# Patient Record
Sex: Male | Born: 1980
Health system: Southern US, Community
[De-identification: ages and names within clinical notes are randomized; demographics above are authoritative.]

## PROBLEM LIST (undated history)

## (undated) DIAGNOSIS — S46811A Strain of other muscles, fascia and tendons at shoulder and upper arm level, right arm, initial encounter: Secondary | ICD-10-CM

---

## 2011-10-30 ENCOUNTER — Ambulatory Visit (HOSPITAL_BASED_OUTPATIENT_CLINIC_OR_DEPARTMENT_OTHER)
Admission: RE | Admit: 2011-10-30 | Discharge: 2011-10-30 | Disposition: A | Discharge status: Home / Self Care | Payer: Self-pay | Source: Ambulatory Visit | Attending: Occupational Medicine | Admitting: Occupational Medicine

## 2011-10-30 ENCOUNTER — Other Ambulatory Visit: Payer: Self-pay | Admitting: Occupational Medicine

## 2011-10-30 DIAGNOSIS — Z0000 Encounter for general adult medical examination without abnormal findings: Secondary | ICD-10-CM | POA: Insufficient documentation

## 2013-02-10 ENCOUNTER — Encounter: Payer: Self-pay | Admitting: Sports Medicine

## 2013-02-10 ENCOUNTER — Ambulatory Visit (INDEPENDENT_AMBULATORY_CARE_PROVIDER_SITE_OTHER): Payer: Managed Care, Other (non HMO) | Admitting: Sports Medicine

## 2013-02-10 VITALS — BP 121/73 | HR 65 | Wt 165.0 lb

## 2013-02-10 DIAGNOSIS — M545 Low back pain, unspecified: Secondary | ICD-10-CM

## 2013-02-10 DIAGNOSIS — M25519 Pain in unspecified shoulder: Secondary | ICD-10-CM

## 2013-02-10 DIAGNOSIS — Z299 Encounter for prophylactic measures, unspecified: Secondary | ICD-10-CM

## 2013-02-10 DIAGNOSIS — M25511 Pain in right shoulder: Secondary | ICD-10-CM | POA: Insufficient documentation

## 2013-02-10 DIAGNOSIS — Z0000 Encounter for general adult medical examination without abnormal findings: Secondary | ICD-10-CM | POA: Insufficient documentation

## 2013-02-10 MED ORDER — MELOXICAM 15 MG PO TABS: ORAL_TABLET | ORAL | Status: DC

## 2013-02-10 MED ORDER — PREDNISONE 50 MG PO TABS: ORAL_TABLET | ORAL | Status: DC

## 2013-02-10 MED ORDER — CYCLOBENZAPRINE HCL 10 MG PO TABS: ORAL_TABLET | ORAL | Status: DC

## 2013-02-10 NOTE — Assessment & Plan Note (Signed)
Likely rotator cuff dysfunction. Home rehabilitation with Rockwood exercises. Return in 6 weeks, consider injection if no better.

## 2013-02-10 NOTE — Assessment & Plan Note (Signed)
Patient does desire to establish care here.

## 2013-02-10 NOTE — Progress Notes (Signed)
  Subjective:    CC: Back and shoulder pain   HPI:  This is a very pleasant 32 year old male, who comes in with a one-week history of pain he localizes in the midline of the low back radiating to the paralumbar muscles, he denies any radicular symptoms, he denies any injury, he just woke up one day with this pain. Pain is worsened with sitting, Valsalva, flexion of the spine and, and flexion of his hips. He denies any bowel or bladder dysfunction, and denies any constitutional symptoms.  Right shoulder pain: Has been painful for years, localized over the deltoid, worse with overhead activities. No recent trauma. No pain in the neck, the radiation down the arm, numbness or tingling in the hands.  Past medical history, Surgical history, Family history not pertinant except as noted below, Social history, Allergies, and medications have been entered into the medical record, reviewed, and no changes needed.   Review of Systems: No headache, visual changes, nausea, vomiting, diarrhea, constipation, dizziness, abdominal pain, skin rash, fevers, chills, night sweats, swollen lymph nodes, weight loss, chest pain, body aches, joint swelling, muscle aches, shortness of breath, mood changes, visual or auditory hallucinations.  Objective:    General: Well Developed, well nourished, and in no acute distress.  Neuro: Alert and oriented x3, extra-ocular muscles intact, sensation grossly intact.  HEENT: Normocephalic, atraumatic, pupils equal round reactive to light, neck supple, no masses, no lymphadenopathy, thyroid nonpalpable.  Skin: Warm and dry, no rashes noted.  Cardiac: Regular rate and rhythm, no murmurs rubs or gallops.  Respiratory: Clear to auscultation bilaterally. Not using accessory muscles, speaking in full sentences.  Abdominal: Soft, nontender, nondistended, positive bowel sounds, no masses, no organomegaly.  Back Exam:  Inspection: Unremarkable  Motion: Flexion 45 deg, Extension 45 deg,  Side Bending to 45 deg bilaterally,  Rotation to 45 deg bilaterally  SLR laying: Bilaterally reproduces pain in the back but no radicular symptoms.  XSLR laying: Negative  Palpable tenderness: None. FABER: negative. Sensory change: Gross sensation intact to all lumbar and sacral dermatomes.  Reflexes: 2+ at both patellar tendons, 2+ at achilles tendons, Babinski's downgoing.  Strength at foot  Plantar-flexion: 5/5 Dorsi-flexion: 5/5 Eversion: 5/5 Inversion: 5/5  Leg strength  Quad: 5/5 Hamstring: 5/5 Hip flexor: 5/5 Hip abductors: 5/5  Gait unremarkable. Right Shoulder: Inspection reveals no abnormalities, atrophy or asymmetry. Palpation is normal with no tenderness over AC joint or bicipital groove. ROM is full in all planes. Rotator cuff strength normal throughout. Positive Neer and Hawkin's tests, empty can sign. Speeds and Yergason's tests normal. No labral pathology noted with negative Obrien's, negative clunk and good stability. Normal scapular function observed. No painful arc and no drop arm sign. No apprehension sign  Impression and Recommendations:    The patient was counselled, risk factors were discussed, anticipatory guidance given.

## 2013-02-10 NOTE — Assessment & Plan Note (Signed)
Predominantly axial and discogenic. We will start conservatively with prednisone, NSAIDs, Flexeril. Home rehabilitation. Return in 6 weeks.

## 2013-03-23 ENCOUNTER — Encounter: Payer: Self-pay | Admitting: Sports Medicine

## 2013-03-23 ENCOUNTER — Ambulatory Visit (INDEPENDENT_AMBULATORY_CARE_PROVIDER_SITE_OTHER): Payer: Managed Care, Other (non HMO) | Admitting: Sports Medicine

## 2013-03-23 VITALS — BP 119/78 | HR 79 | Ht 74.0 in | Wt 164.0 lb

## 2013-03-23 DIAGNOSIS — M25519 Pain in unspecified shoulder: Secondary | ICD-10-CM

## 2013-03-23 DIAGNOSIS — M25511 Pain in right shoulder: Secondary | ICD-10-CM

## 2013-03-23 DIAGNOSIS — M545 Low back pain, unspecified: Secondary | ICD-10-CM

## 2013-03-23 NOTE — Assessment & Plan Note (Signed)
Persistent pain despite failure of conservative measures including Rockwood exercises, NSAIDs. Subacromial injection as above. Return to see me in one month.

## 2013-03-23 NOTE — Progress Notes (Signed)
  Subjective:    CC: Followup  HPI: Right shoulder pain: Clinically subacromial bursitis, pain is still a pleasant deltoid without radiation, worse with overhead activities, no trauma. Failed conservative measures including Rockwood exercises and NSAIDs.  Low back pain: Resolved.  Past medical history, Surgical history, Family history not pertinant except as noted below, Social history, Allergies, and medications have been entered into the medical record, reviewed, and no changes needed.   Review of Systems: No fevers, chills, night sweats, weight loss, chest pain, or shortness of breath.   Objective:    General: Well Developed, well nourished, and in no acute distress.  Neuro: Alert and oriented x3, extra-ocular muscles intact, sensation grossly intact.  HEENT: Normocephalic, atraumatic, pupils equal round reactive to light, neck supple, no masses, no lymphadenopathy, thyroid nonpalpable.  Skin: Warm and dry, no rashes. Cardiac: Regular rate and rhythm, no murmurs rubs or gallops, no lower extremity edema.  Respiratory: Clear to auscultation bilaterally. Not using accessory muscles, speaking in full sentences. Right Shoulder: Inspection reveals no abnormalities, atrophy or asymmetry. Palpation is normal with no tenderness over AC joint or bicipital groove. ROM is full in all planes. Rotator cuff strength normal throughout. Positive Hawkins, empty can sign, negative Neer test. Speeds and Yergason's tests normal. No labral pathology noted with negative Obrien's, negative clunk and good stability. Normal scapular function observed. No painful arc and no drop arm sign. No apprehension sign  Procedure: Real-time Ultrasound Guided Injection of right subacromial bursa  Device: GE Logiq E  Verbal informed consent obtained.  Time-out conducted.  Noted no overlying erythema, induration, or other signs of local infection.  Skin prepped in a sterile fashion.  Local anesthesia: Topical  Ethyl chloride.  With sterile technique and under real time ultrasound guidance:  1 cc Kenalog 40, 3 cc lidocaine injected easily into the subacromial bursa.  Completed without difficulty  Pain immediately resolved suggesting accurate placement of the medication.  Advised to call if fevers/chills, erythema, induration, drainage, or persistent bleeding.  Images permanently stored and available for review in the ultrasound unit.  Impression: Technically successful ultrasound guided injection.  Impression and Recommendations:

## 2013-03-23 NOTE — Assessment & Plan Note (Signed)
Resolved

## 2013-04-21 ENCOUNTER — Encounter: Payer: Self-pay | Admitting: Sports Medicine

## 2013-04-21 ENCOUNTER — Ambulatory Visit (INDEPENDENT_AMBULATORY_CARE_PROVIDER_SITE_OTHER): Payer: Managed Care, Other (non HMO) | Admitting: Sports Medicine

## 2013-04-21 VITALS — BP 133/91 | HR 74 | Ht 74.0 in | Wt 164.0 lb

## 2013-04-21 DIAGNOSIS — M25511 Pain in right shoulder: Secondary | ICD-10-CM

## 2013-04-21 DIAGNOSIS — M25519 Pain in unspecified shoulder: Secondary | ICD-10-CM

## 2013-04-21 NOTE — Progress Notes (Signed)
  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  Past medical history, Surgical history, Family history not pertinant except as noted below, Social history, Allergies, and medications have been entered into the medical record, reviewed, and no changes needed.   Review of Systems: No fevers, chills, night sweats, weight loss, chest pain, or shortness of breath.   Objective:    General: Well Developed, well nourished, and in no acute distress.  Neuro: Alert and oriented x3, extra-ocular muscles intact, sensation grossly intact.  HEENT: Normocephalic, atraumatic, pupils equal round reactive to light, neck supple, no masses, no lymphadenopathy, thyroid nonpalpable.  Skin: Warm and dry, no rashes. Cardiac: Regular rate and rhythm, no murmurs rubs or gallops, no lower extremity edema.  Respiratory: Clear to auscultation bilaterally. Not using accessory muscles, speaking in full sentences. Right Shoulder: Inspection reveals no abnormalities, atrophy or asymmetry. Palpation is normal with no tenderness over AC joint or bicipital groove. ROM is full in all planes. Rotator cuff strength normal throughout. Negative Neer and Empty Can signs, mildly positive Hawkins test. Speeds and Yergason's tests normal. No labral pathology noted with negative Obrien's, negative clunk and good stability. Normal scapular function observed. No painful arc and no drop arm sign. No apprehension sign  Impression and Recommendations:

## 2013-04-21 NOTE — Assessment & Plan Note (Signed)
Essentially resolved. Return as needed.

## 2013-11-16 ENCOUNTER — Ambulatory Visit (INDEPENDENT_AMBULATORY_CARE_PROVIDER_SITE_OTHER): Payer: Managed Care, Other (non HMO) | Admitting: Sports Medicine

## 2013-11-16 ENCOUNTER — Encounter: Payer: Self-pay | Admitting: Sports Medicine

## 2013-11-16 ENCOUNTER — Ambulatory Visit (INDEPENDENT_AMBULATORY_CARE_PROVIDER_SITE_OTHER): Payer: Managed Care, Other (non HMO)

## 2013-11-16 VITALS — BP 120/79 | HR 71 | Ht 74.0 in | Wt 158.0 lb

## 2013-11-16 DIAGNOSIS — M25511 Pain in right shoulder: Secondary | ICD-10-CM

## 2013-11-16 DIAGNOSIS — M25519 Pain in unspecified shoulder: Secondary | ICD-10-CM

## 2013-11-16 MED ORDER — DICLOFENAC SODIUM 75 MG PO TBEC: 75.0000 mg | DELAYED_RELEASE_TABLET | Freq: Two times a day (BID) | ORAL | Status: DC

## 2013-11-16 NOTE — Progress Notes (Signed)
  Subjective:    CC: Right shoulder injury  HPI: This is a very pleasant 33 year old male, we have been treating him for subacromial bursitis, he was doing well after a subacromial injection in January unfortunately had a recent fall backwards onto an outstretched right hand. He had immediate pain at the anterior and posterior joint line, and presents for further evaluation. Pain is severe, persistent, worse with reaching across his chest.  Past medical history, Surgical history, Family history not pertinant except as noted below, Social history, Allergies, and medications have been entered into the medical record, reviewed, and no changes needed.   Review of Systems: No fevers, chills, night sweats, weight loss, chest pain, or shortness of breath.   Objective:    General: Well Developed, well nourished, and in no acute distress.  Neuro: Alert and oriented x3, extra-ocular muscles intact, sensation grossly intact.  HEENT: Normocephalic, atraumatic, pupils equal round reactive to light, neck supple, no masses, no lymphadenopathy, thyroid nonpalpable.  Skin: Warm and dry, no rashes. Cardiac: Regular rate and rhythm, no murmurs rubs or gallops, no lower extremity edema.  Respiratory: Clear to auscultation bilaterally. Not using accessory muscles, speaking in full sentences. Right Shoulder: Inspection reveals no abnormalities, atrophy or asymmetry. Tendon palpation of the posterior joint line. ROM is full in all planes. Rotator cuff strength normal throughout. Positive Hawkins, Neers, Empty can signs. Speeds and Yergason's tests normal. No labral pathology noted with negative Obrien's, negative crank, negative clunk, and good stability. Normal scapular function observed. No painful arc and no drop arm sign. No apprehension sign  Impression and Recommendations:

## 2013-11-16 NOTE — Assessment & Plan Note (Signed)
Overall was doing well after subacromial injection 8 months ago. Unfortunately had a recent fall onto an extended right arm. Now has pain predominantly at the joint line anteriorly and posteriorly. X-rays, swelling, Voltaren, rehabilitation exercises. Return in 2 weeks.

## 2013-11-30 ENCOUNTER — Encounter: Payer: Self-pay | Admitting: Sports Medicine

## 2013-11-30 ENCOUNTER — Ambulatory Visit (INDEPENDENT_AMBULATORY_CARE_PROVIDER_SITE_OTHER): Payer: Managed Care, Other (non HMO) | Admitting: Sports Medicine

## 2013-11-30 VITALS — BP 128/81 | HR 61 | Ht 74.0 in | Wt 163.0 lb

## 2013-11-30 DIAGNOSIS — M25511 Pain in right shoulder: Secondary | ICD-10-CM

## 2013-11-30 NOTE — Assessment & Plan Note (Signed)
Subacromial injection 9 months ago. Fallen to an extended right arm with a recurrence of pain, we treated him conservatively with a sling, rehabilitation exercises and NSAIDs. He returns today pain-free. He gets a pain traveling below the elbow in C8 distribution, we should keep this in the back of our heads if this recurs.

## 2013-11-30 NOTE — Progress Notes (Signed)
  Subjective:    CC: Followup  HPI: This is a very pleasant 33 year old male with a history of rotator cuff dysfunction. After failing conservative measures he did extremely well with a subacromial injection approximately 9 months ago. Unfortunately he had a fall, with a recurrence of pain 2 weeks ago. We placed him in a sling, and had him do some rehabilitation exercises, he returns pain-free.  Past medical history, Surgical history, Family history not pertinant except as noted below, Social history, Allergies, and medications have been entered into the medical record, reviewed, and no changes needed.   Review of Systems: No fevers, chills, night sweats, weight loss, chest pain, or shortness of breath.   Objective:    General: Well Developed, well nourished, and in no acute distress.  Neuro: Alert and oriented x3, extra-ocular muscles intact, sensation grossly intact.  HEENT: Normocephalic, atraumatic, pupils equal round reactive to light, neck supple, no masses, no lymphadenopathy, thyroid nonpalpable.  Skin: Warm and dry, no rashes. Cardiac: Regular rate and rhythm, no murmurs rubs or gallops, no lower extremity edema.  Respiratory: Clear to auscultation bilaterally. Not using accessory muscles, speaking in full sentences. Right Shoulder: Inspection reveals no abnormalities, atrophy or asymmetry. Palpation is normal with no tenderness over AC joint or bicipital groove. ROM is full in all planes. Rotator cuff strength normal throughout. No signs of impingement with negative Neer and Hawkin's tests, empty can. Speeds and Yergason's tests normal. No labral pathology noted with negative Obrien's, negative crank, negative clunk, and good stability. Normal scapular function observed. No painful arc and no drop arm sign. No apprehension sign  Impression and Recommendations:

## 2013-12-24 ENCOUNTER — Encounter: Payer: Self-pay | Admitting: Sports Medicine

## 2013-12-24 ENCOUNTER — Ambulatory Visit (INDEPENDENT_AMBULATORY_CARE_PROVIDER_SITE_OTHER): Payer: Managed Care, Other (non HMO)

## 2013-12-24 ENCOUNTER — Ambulatory Visit (INDEPENDENT_AMBULATORY_CARE_PROVIDER_SITE_OTHER): Payer: Managed Care, Other (non HMO) | Admitting: Sports Medicine

## 2013-12-24 DIAGNOSIS — M25511 Pain in right shoulder: Secondary | ICD-10-CM

## 2013-12-24 DIAGNOSIS — M542 Cervicalgia: Secondary | ICD-10-CM

## 2013-12-24 MED ORDER — NAPROXEN 500 MG PO TABS: 500.0000 mg | ORAL_TABLET | Freq: Two times a day (BID) | ORAL | Status: DC

## 2013-12-24 NOTE — Assessment & Plan Note (Addendum)
Recurrence of pain, impingement despite injection proximally 10 months ago. Repeat subacromial injection. He is getting some right-sided C8 radicular symptoms. I do think that this is related to abnormal scapular biomechanics related to his impingement syndrome. Subacromial injection, shoulder MRI, cervical spine x-rays, formal physical therapy of the neck and shoulder. Return in one month. We can proceed with C-spine MRI if he continues to have symptoms.  Subacromial injection did resolve lower arm symptoms as well suggesting that the subacromial bursa is the principal pain generator, we are going to switch to prescription strength Aleve.

## 2013-12-24 NOTE — Progress Notes (Signed)
  Subjective:    CC: follow-up  HPI: Right shoulder pain: Initially responded well for 9 months after a subacromial injection, he had a fall and then had a recurrence of pain. Localized over the deltoid, worse with overhead activities, also has some pain with numbness and tingling coming down the arm. He feels as though the shoulder pain started first. Moderate, persistent.  Past medical history, Surgical history, Family history not pertinant except as noted below, Social history, Allergies, and medications have been entered into the medical record, reviewed, and no changes needed.   Review of Systems: No fevers, chills, night sweats, weight loss, chest pain, or shortness of breath.   Objective:    General: Well Developed, well nourished, and in no acute distress.  Neuro: Alert and oriented x3, extra-ocular muscles intact, sensation grossly intact.  HEENT: Normocephalic, atraumatic, pupils equal round reactive to light, neck supple, no masses, no lymphadenopathy, thyroid nonpalpable.  Skin: Warm and dry, no rashes. Cardiac: Regular rate and rhythm, no murmurs rubs or gallops, no lower extremity edema.  Respiratory: Clear to auscultation bilaterally. Not using accessory muscles, speaking in full sentences. Neck: Negative spurling's Full neck range of motion Grip strength and sensation normal in bilateral hands Strength good C4 to T1 distribution No sensory change to C4 to T1 Reflexes normal rightShoulder: Inspection reveals no abnormalities, atrophy or asymmetry. Palpation is normal with no tenderness over AC joint or bicipital groove. ROM is full in all planes. Rotator cuff strength normal throughout. Positive Neer and Hawkin's tests, empty can. Speeds and Yergason's tests normal. No labral pathology noted with negative Obrien's, negative crank, negative clunk, and good stability. Normal scapular function observed. No painful arc and no drop arm sign. No apprehension  sign  Procedure: Real-time Ultrasound Guided Injection of right subacromial bursa Device: GE Logiq E  Verbal informed consent obtained.  Time-out conducted.  Noted no overlying erythema, induration, or other signs of local infection.  Skin prepped in a sterile fashion.  Local anesthesia: Topical Ethyl chloride.  With sterile technique and under real time ultrasound guidance:  1 mL kenalog 40, 4 mL lidocaine injected easily. Completed without difficulty  Pain immediately resolved suggesting accurate placement of the medication.  Advised to call if fevers/chills, erythema, induration, drainage, or persistent bleeding.  Images permanently stored and available for review in the ultrasound unit.  Impression: Technically successful ultrasound guided injection.  Impression and Recommendations:

## 2014-01-21 ENCOUNTER — Ambulatory Visit: Payer: Self-pay | Admitting: Sports Medicine

## 2014-03-05 ENCOUNTER — Telehealth: Payer: Self-pay

## 2014-03-05 NOTE — Telephone Encounter (Signed)
MRI Shoulder without contrast approved by peer to peer with Dr Benjamin Stainhekkekandam. Patient scheduled for 03/08/2014 at 2 pm.

## 2014-03-08 ENCOUNTER — Ambulatory Visit (INDEPENDENT_AMBULATORY_CARE_PROVIDER_SITE_OTHER): Payer: Managed Care, Other (non HMO)

## 2014-03-08 DIAGNOSIS — M779 Enthesopathy, unspecified: Secondary | ICD-10-CM

## 2014-03-08 DIAGNOSIS — M25511 Pain in right shoulder: Secondary | ICD-10-CM

## 2014-03-08 DIAGNOSIS — M7551 Bursitis of right shoulder: Secondary | ICD-10-CM

## 2014-03-08 DIAGNOSIS — M75101 Unspecified rotator cuff tear or rupture of right shoulder, not specified as traumatic: Secondary | ICD-10-CM

## 2014-03-11 ENCOUNTER — Ambulatory Visit (INDEPENDENT_AMBULATORY_CARE_PROVIDER_SITE_OTHER): Payer: Managed Care, Other (non HMO) | Admitting: Sports Medicine

## 2014-03-11 ENCOUNTER — Encounter: Payer: Self-pay | Admitting: Sports Medicine

## 2014-03-11 DIAGNOSIS — M25511 Pain in right shoulder: Secondary | ICD-10-CM

## 2014-03-11 MED ORDER — CYCLOBENZAPRINE HCL 10 MG PO TABS: 10.0000 mg | ORAL_TABLET | Freq: Every day | ORAL | Status: DC

## 2014-03-11 NOTE — Progress Notes (Signed)
  Subjective:    CC: MRI results  HPI: Victor Donaldson returns, Victor Donaldson has had 3 subacromial ultrasound guided injections, and physical therapy, unfortunately continues to have pain that he localizes over the deltoid, worse with overhead activities, moderate, persistent. He did have an MRI the results of which will be dictated below.  Past medical history, Surgical history, Family history not pertinant except as noted below, Social history, Allergies, and medications have been entered into the medical record, reviewed, and no changes needed.   Review of Systems: No fevers, chills, night sweats, weight loss, chest pain, or shortness of breath.   Objective:    General: Well Developed, well nourished, and in no acute distress.  Neuro: Alert and oriented x3, extra-ocular muscles intact, sensation grossly intact.  HEENT: Normocephalic, atraumatic, pupils equal round reactive to light, neck supple, no masses, no lymphadenopathy, thyroid nonpalpable.  Skin: Warm and dry, no rashes. Cardiac: Regular rate and rhythm, no murmurs rubs or gallops, no lower extremity edema.  Respiratory: Clear to auscultation bilaterally. Not using accessory muscles, speaking in full sentences.  MRI was personally reviewed and do show supraspinatus tendinosis with a small insertional tear caretaker surface, is also a downsloping acromion and some subacromial bursitis.  Impression and Recommendations:

## 2014-03-11 NOTE — Assessment & Plan Note (Signed)
Victor Donaldson continues to have right-sided impingement type symptoms. He's been through rehabilitation, oral medications, and 3 ultrasound-guided subacromial injections. Symptoms are persistent and the most recent injection only provided a month or so of relief. MRI does show significant supraspinatus tendinopathy with a downsloping acromion, subacromial bursitis, and a small articular sided tear at the insertion. At this point I do think he has become a candidate for shoulder arthroscopy, referral to Dr. Ave Filterhandler.

## 2014-03-17 ENCOUNTER — Other Ambulatory Visit: Payer: Self-pay | Admitting: Orthopedic Surgery

## 2014-03-17 ENCOUNTER — Encounter (HOSPITAL_BASED_OUTPATIENT_CLINIC_OR_DEPARTMENT_OTHER): Payer: Self-pay | Admitting: *Deleted

## 2014-03-22 ENCOUNTER — Ambulatory Visit (HOSPITAL_BASED_OUTPATIENT_CLINIC_OR_DEPARTMENT_OTHER): Payer: Managed Care, Other (non HMO) | Admitting: Anesthesiology

## 2014-03-22 ENCOUNTER — Encounter (HOSPITAL_BASED_OUTPATIENT_CLINIC_OR_DEPARTMENT_OTHER): Payer: Self-pay

## 2014-03-22 ENCOUNTER — Encounter (HOSPITAL_BASED_OUTPATIENT_CLINIC_OR_DEPARTMENT_OTHER)
Admission: RE | Disposition: A | Discharge status: Home / Self Care | Payer: Self-pay | Source: Ambulatory Visit | Attending: Orthopedic Surgery

## 2014-03-22 ENCOUNTER — Ambulatory Visit (HOSPITAL_BASED_OUTPATIENT_CLINIC_OR_DEPARTMENT_OTHER)
Admission: RE | Admit: 2014-03-22 | Discharge: 2014-03-22 | Disposition: A | Discharge status: Home / Self Care | Payer: Managed Care, Other (non HMO) | Source: Ambulatory Visit | Attending: Orthopedic Surgery | Admitting: Orthopedic Surgery

## 2014-03-22 DIAGNOSIS — M75101 Unspecified rotator cuff tear or rupture of right shoulder, not specified as traumatic: Secondary | ICD-10-CM | POA: Diagnosis not present

## 2014-03-22 HISTORY — DX: Strain of other muscles, fascia and tendons at shoulder and upper arm level, right arm, initial encounter: S46.811A

## 2014-03-22 LAB — POCT HEMOGLOBIN-HEMACUE: HEMOGLOBIN: 14 g/dL (ref 13.0–17.0)

## 2014-03-22 SURGERY — SHOULDER ARTHROSCOPY WITH SUBACROMIAL DECOMPRESSION AND DISTAL CLAVICLE EXCISION
Anesthesia: General | Site: Shoulder | Laterality: Right

## 2014-03-22 MED ORDER — LIDOCAINE HCL (CARDIAC) 20 MG/ML IV SOLN
INTRAVENOUS | Status: DC | PRN
  Administered 2014-03-22: 50 mg via INTRAVENOUS

## 2014-03-22 MED ORDER — SUCCINYLCHOLINE CHLORIDE 20 MG/ML IJ SOLN
INTRAMUSCULAR | Status: DC | PRN
  Administered 2014-03-22: 100 mg via INTRAVENOUS

## 2014-03-22 MED ORDER — PROPOFOL 10 MG/ML IV BOLUS
INTRAVENOUS | Status: AC
  Filled 2014-03-22: qty 20

## 2014-03-22 MED ORDER — OXYCODONE HCL 5 MG PO TABS: 5.0000 mg | ORAL_TABLET | Freq: Once | ORAL | Status: DC | PRN

## 2014-03-22 MED ORDER — BUPIVACAINE-EPINEPHRINE (PF) 0.5% -1:200000 IJ SOLN
INTRAMUSCULAR | Status: DC | PRN
  Administered 2014-03-22: 25 mL via PERINEURAL

## 2014-03-22 MED ORDER — MIDAZOLAM HCL 2 MG/2ML IJ SOLN
1.0000 mg | INTRAMUSCULAR | Status: DC | PRN
  Administered 2014-03-22: 2 mg via INTRAVENOUS

## 2014-03-22 MED ORDER — OXYCODONE HCL 5 MG/5ML PO SOLN: 5.0000 mg | Freq: Once | ORAL | Status: DC | PRN

## 2014-03-22 MED ORDER — POVIDONE-IODINE 7.5 % EX SOLN: Freq: Once | CUTANEOUS | Status: DC

## 2014-03-22 MED ORDER — DEXAMETHASONE SODIUM PHOSPHATE 4 MG/ML IJ SOLN
INTRAMUSCULAR | Status: DC | PRN
  Administered 2014-03-22: 10 mg via INTRAVENOUS

## 2014-03-22 MED ORDER — ONDANSETRON HCL 4 MG/2ML IJ SOLN
INTRAMUSCULAR | Status: DC | PRN
  Administered 2014-03-22: 4 mg via INTRAVENOUS

## 2014-03-22 MED ORDER — MIDAZOLAM HCL 2 MG/2ML IJ SOLN
INTRAMUSCULAR | Status: AC
  Filled 2014-03-22: qty 2

## 2014-03-22 MED ORDER — LACTATED RINGERS IV SOLN
INTRAVENOUS | Status: DC
  Administered 2014-03-22 (×2): via INTRAVENOUS

## 2014-03-22 MED ORDER — CEFAZOLIN SODIUM-DEXTROSE 2-3 GM-% IV SOLR
INTRAVENOUS | Status: AC
  Filled 2014-03-22: qty 50

## 2014-03-22 MED ORDER — CEFAZOLIN SODIUM-DEXTROSE 2-3 GM-% IV SOLR
2.0000 g | INTRAVENOUS | Status: AC
  Administered 2014-03-22: 2 g via INTRAVENOUS

## 2014-03-22 MED ORDER — FENTANYL CITRATE 0.05 MG/ML IJ SOLN
INTRAMUSCULAR | Status: AC
  Filled 2014-03-22: qty 6

## 2014-03-22 MED ORDER — FENTANYL CITRATE 0.05 MG/ML IJ SOLN
50.0000 ug | INTRAMUSCULAR | Status: DC | PRN
  Administered 2014-03-22: 100 ug via INTRAVENOUS

## 2014-03-22 MED ORDER — SODIUM CHLORIDE 0.9 % IR SOLN
Status: DC | PRN
  Administered 2014-03-22: 6000 mL

## 2014-03-22 MED ORDER — OXYCODONE-ACETAMINOPHEN 5-325 MG PO TABS: 1.0000 | ORAL_TABLET | ORAL | Status: DC | PRN

## 2014-03-22 MED ORDER — PROPOFOL 10 MG/ML IV BOLUS
INTRAVENOUS | Status: DC | PRN
  Administered 2014-03-22: 200 mg via INTRAVENOUS

## 2014-03-22 MED ORDER — ONDANSETRON HCL 4 MG/2ML IJ SOLN: 4.0000 mg | Freq: Once | INTRAMUSCULAR | Status: DC | PRN

## 2014-03-22 MED ORDER — FENTANYL CITRATE 0.05 MG/ML IJ SOLN
INTRAMUSCULAR | Status: AC
  Filled 2014-03-22: qty 2

## 2014-03-22 MED ORDER — DOCUSATE SODIUM 100 MG PO CAPS: 100.0000 mg | ORAL_CAPSULE | Freq: Three times a day (TID) | ORAL | Status: DC | PRN

## 2014-03-22 MED ORDER — HYDROMORPHONE HCL 1 MG/ML IJ SOLN: 0.2500 mg | INTRAMUSCULAR | Status: DC | PRN

## 2014-03-22 SURGICAL SUPPLY — 82 items
BENZOIN TINCTURE PRP APPL 2/3 (GAUZE/BANDAGES/DRESSINGS) IMPLANT
BLADE CLIPPER SURG (BLADE) IMPLANT
BLADE SURG 15 STRL LF DISP TIS (BLADE) IMPLANT
BLADE SURG 15 STRL SS (BLADE)
BLADE VORTEX 6.0 (BLADE) IMPLANT
BUR OVAL 4.0 (BURR) ×2 IMPLANT
CANNULA 5.75X71 LONG (CANNULA) ×2 IMPLANT
CANNULA TWIST IN 8.25X7CM (CANNULA) IMPLANT
CHLORAPREP W/TINT 26ML (MISCELLANEOUS) ×2 IMPLANT
DECANTER SPIKE VIAL GLASS SM (MISCELLANEOUS) IMPLANT
DRAPE INCISE IOBAN 66X45 STRL (DRAPES) ×2 IMPLANT
DRAPE STERI 35X30 U-POUCH (DRAPES) ×2 IMPLANT
DRAPE SURG 17X23 STRL (DRAPES) ×2 IMPLANT
DRAPE U 20/CS (DRAPES) ×2 IMPLANT
DRAPE U-SHAPE 47X51 STRL (DRAPES) ×2 IMPLANT
DRAPE U-SHAPE 76X120 STRL (DRAPES) ×4 IMPLANT
DRSG PAD ABDOMINAL 8X10 ST (GAUZE/BANDAGES/DRESSINGS) ×2 IMPLANT
ELECT BLADE 4.0 EZ CLEAN MEGAD (MISCELLANEOUS)
ELECT REM PT RETURN 9FT ADLT (ELECTROSURGICAL) ×2
ELECTRODE BLDE 4.0 EZ CLN MEGD (MISCELLANEOUS) IMPLANT
ELECTRODE REM PT RTRN 9FT ADLT (ELECTROSURGICAL) ×1 IMPLANT
GAUZE SPONGE 4X4 12PLY STRL (GAUZE/BANDAGES/DRESSINGS) ×2 IMPLANT
GAUZE SPONGE 4X4 16PLY XRAY LF (GAUZE/BANDAGES/DRESSINGS) IMPLANT
GAUZE XEROFORM 1X8 LF (GAUZE/BANDAGES/DRESSINGS) ×2 IMPLANT
GLOVE BIO SURGEON STRL SZ7 (GLOVE) ×2 IMPLANT
GLOVE BIO SURGEON STRL SZ7.5 (GLOVE) ×2 IMPLANT
GLOVE BIOGEL PI IND STRL 7.0 (GLOVE) ×2 IMPLANT
GLOVE BIOGEL PI IND STRL 8 (GLOVE) ×1 IMPLANT
GLOVE BIOGEL PI INDICATOR 7.0 (GLOVE) ×2
GLOVE BIOGEL PI INDICATOR 8 (GLOVE) ×1
GLOVE ECLIPSE 6.5 STRL STRAW (GLOVE) ×2 IMPLANT
GOWN STRL REUS W/ TWL LRG LVL3 (GOWN DISPOSABLE) ×2 IMPLANT
GOWN STRL REUS W/TWL LRG LVL3 (GOWN DISPOSABLE) ×2
GOWN STRL REUS W/TWL XL LVL3 (GOWN DISPOSABLE) IMPLANT
GOWN STRL REUS W/TWL XL LVL4 (GOWN DISPOSABLE) IMPLANT
LASSO CRESCENT QUICKPASS (SUTURE) IMPLANT
LIQUID BAND (GAUZE/BANDAGES/DRESSINGS) IMPLANT
MANIFOLD NEPTUNE II (INSTRUMENTS) ×2 IMPLANT
NDL SUT 6 .5 CRC .975X.05 MAYO (NEEDLE) IMPLANT
NEEDLE 1/2 CIR CATGUT .05X1.09 (NEEDLE) IMPLANT
NEEDLE MAYO TAPER (NEEDLE)
NEEDLE SCORPION MULTI FIRE (NEEDLE) IMPLANT
NS IRRIG 1000ML POUR BTL (IV SOLUTION) IMPLANT
PACK ARTHROSCOPY DSU (CUSTOM PROCEDURE TRAY) ×2 IMPLANT
PACK BASIN DAY SURGERY FS (CUSTOM PROCEDURE TRAY) ×2 IMPLANT
PENCIL BUTTON HOLSTER BLD 10FT (ELECTRODE) IMPLANT
RESECTOR FULL RADIUS 4.2MM (BLADE) ×2 IMPLANT
SLEEVE SCD COMPRESS KNEE MED (MISCELLANEOUS) ×2 IMPLANT
SLING ARM IMMOBILIZER MED (SOFTGOODS) IMPLANT
SLING ARM LRG ADULT FOAM STRAP (SOFTGOODS) ×2 IMPLANT
SLING ARM MED ADULT FOAM STRAP (SOFTGOODS) IMPLANT
SLING ARM XL FOAM STRAP (SOFTGOODS) IMPLANT
SPONGE LAP 4X18 X RAY DECT (DISPOSABLE) IMPLANT
STRIP CLOSURE SKIN 1/2X4 (GAUZE/BANDAGES/DRESSINGS) IMPLANT
SUCTION FRAZIER TIP 10 FR DISP (SUCTIONS) IMPLANT
SUPPORT WRAP ARM LG (MISCELLANEOUS) ×2 IMPLANT
SUT 2 FIBERLOOP 20 STRT BLUE (SUTURE)
SUT BONE WAX W31G (SUTURE) IMPLANT
SUT ETHIBOND 2 OS 4 DA (SUTURE) IMPLANT
SUT ETHILON 3 0 PS 1 (SUTURE) ×2 IMPLANT
SUT ETHILON 4 0 PS 2 18 (SUTURE) IMPLANT
SUT FIBERWIRE #2 38 T-5 BLUE (SUTURE)
SUT MNCRL AB 3-0 PS2 18 (SUTURE) IMPLANT
SUT MNCRL AB 4-0 PS2 18 (SUTURE) IMPLANT
SUT PDS AB 0 CT 36 (SUTURE) IMPLANT
SUT PROLENE 3 0 PS 2 (SUTURE) IMPLANT
SUT TIGER TAPE 7 IN WHITE (SUTURE) IMPLANT
SUT VIC AB 0 CT1 27 (SUTURE)
SUT VIC AB 0 CT1 27XBRD ANBCTR (SUTURE) IMPLANT
SUT VIC AB 2-0 SH 27 (SUTURE)
SUT VIC AB 2-0 SH 27XBRD (SUTURE) IMPLANT
SUTURE 2 FIBERLOOP 20 STRT BLU (SUTURE) IMPLANT
SUTURE FIBERWR #2 38 T-5 BLUE (SUTURE) IMPLANT
SYR BULB 3OZ (MISCELLANEOUS) IMPLANT
TAPE FIBER 2MM 7IN #2 BLUE (SUTURE) IMPLANT
TOWEL OR 17X24 6PK STRL BLUE (TOWEL DISPOSABLE) ×2 IMPLANT
TOWEL OR NON WOVEN STRL DISP B (DISPOSABLE) ×2 IMPLANT
TUBE CONNECTING 20X1/4 (TUBING) ×2 IMPLANT
TUBING ARTHROSCOPY IRRIG 16FT (MISCELLANEOUS) ×2 IMPLANT
WAND STAR VAC 90 (SURGICAL WAND) ×2 IMPLANT
WATER STERILE IRR 1000ML POUR (IV SOLUTION) ×2 IMPLANT
YANKAUER SUCT BULB TIP NO VENT (SUCTIONS) IMPLANT

## 2014-03-22 NOTE — Anesthesia Postprocedure Evaluation (Signed)
  Anesthesia Post-op Note  Patient: Victor Donaldson  Procedure(s) Performed: Procedure(s): Right shoulder arthroscopy debridement ,  partial articular-sided supraspinatus tendon avulsion, subacromial decompression (Right)  Patient Location: PACU  Anesthesia Type:GA combined with regional for post-op pain  Level of Consciousness: awake, alert , oriented and patient cooperative  Airway and Oxygen Therapy: Patient Spontanous Breathing  Post-op Pain: none  Post-op Assessment: Post-op Vital signs reviewed, Patient's Cardiovascular Status Stable, Respiratory Function Stable, Patent Airway, No signs of Nausea or vomiting, Adequate PO intake and Pain level controlled  Post-op Vital Signs: Reviewed and stable  Last Vitals:  Filed Vitals:   03/22/14 1500  BP: 132/73  Pulse: 85  Temp:   Resp: 16    Complications: No apparent anesthesia complications

## 2014-03-22 NOTE — Progress Notes (Signed)
Assisted Dr. Crews with right, ultrasound guided, interscalene  block. Side rails up, monitors on throughout procedure. See vital signs in flow sheet. Tolerated Procedure well. 

## 2014-03-22 NOTE — Transfer of Care (Signed)
Immediate Anesthesia Transfer of Care Note  Patient: Victor RighterJoshua C Rauda  Procedure(s) Performed: Procedure(s): Right shoulder arthroscopy debridement ,  partial articular-sided supraspinatus tendon avulsion, subacromial decompression (Right)  Patient Location: PACU  Anesthesia Type:General and GA combined with regional for post-op pain  Level of Consciousness: awake  Airway & Oxygen Therapy: Patient Spontanous Breathing and Patient connected to face mask oxygen  Post-op Assessment: Report given to RN and Post -op Vital signs reviewed and stable  Post vital signs: Reviewed and stable  Last Vitals:  Filed Vitals:   03/22/14 1300  BP:   Pulse: 97  Temp:   Resp: 16    Complications: No apparent anesthesia complications

## 2014-03-22 NOTE — H&P (Signed)
Victor Donaldson is an 34 y.o. male.   Chief Complaint: R shoulder pain HPI: R shoudler pain and dysfunction failed conservative treatment.  Past Medical History  Diagnosis Date  . Traumatic tear of supraspinatus tendon of right shoulder     History reviewed. No pertinent past surgical history.  History reviewed. No pertinent family history. Social History:  reports that he has never smoked. He does not have any smokeless tobacco history on file. He reports that he drinks alcohol. He reports that he does not use illicit drugs.  Allergies: No Known Allergies  Medications Prior to Admission  Medication Sig Dispense Refill  . cyclobenzaprine (FLEXERIL) 10 MG tablet Take 1 tablet (10 mg total) by mouth daily. 90 tablet 0  . naproxen (NAPROSYN) 500 MG tablet Take 1 tablet (500 mg total) by mouth 2 (two) times daily with a meal. 60 tablet 3    Results for orders placed or performed during the hospital encounter of 03/22/14 (from the past 48 hour(s))  Hemoglobin-hemacue, POC     Status: None   Collection Time: 03/22/14 12:26 PM  Result Value Ref Range   Hemoglobin 14.0 13.0 - 17.0 g/dL   No results found.  Review of Systems  All other systems reviewed and are negative.   Blood pressure 118/63, pulse 97, temperature 98.3 F (36.8 C), temperature source Oral, resp. rate 16, height 6\' 2"  (1.88 m), weight 71.895 kg (158 lb 8 oz), SpO2 100 %. Physical Exam  Constitutional: He is oriented to person, place, and time. He appears well-developed and well-nourished.  HENT:  Head: Atraumatic.  Eyes: EOM are normal.  Cardiovascular: Intact distal pulses.   Respiratory: Effort normal.  Musculoskeletal:  R shoulder pain with RC testing.  Neurological: He is alert and oriented to person, place, and time.  Skin: Skin is warm and dry.  Psychiatric: He has a normal mood and affect.     Assessment/Plan R shoulder high grade partial RCT, impingement Plan R arth RCR vs debridement, SAD Risks /  benefits of surgery discussed Consent on chart  NPO for OR Preop antibiotics   Raysa Bosak WILLIAM 03/22/2014, 1:09 PM

## 2014-03-22 NOTE — Op Note (Signed)
Procedure(s):   Procedure Note  Victor Donaldson male 34 y.o. 03/22/2014  Procedure(s) and Anesthesia Type: #1 right shoulder arthroscopic extensive debridement partial thickness bursal sided supraspinatus tear, and debridement posterior superior labral tear #2 right shoulder arthroscopic subacromial decompression     Surgeon(s) and Role:    * Mable Paris, MD - Primary     Surgeon: Mable Paris   Assistants: Damita Lack PA-C (Danielle was present and scrubbed throughout the procedure and was essential in positioning, assisting with the camera and instrumentation,, and closure)  Anesthesia: General endotracheal anesthesia with preoperative interscalene block given by the attending anesthesiologist     Procedure Detail    Estimated Blood Loss: Min         Drains: none  Blood Given: none         Specimens: none        Complications:  * No complications entered in OR log *         Disposition: PACU - hemodynamically stable.         Condition: stable    Procedure:   INDICATIONS FOR SURGERY: The patient is 34 y.o. male who has had a long history of right shoulder pain which has been refractory to conservative management with injections, therapy, activity modification. He had an MRI which revealed partial thickness rotator cuff tearing. Is indicated for surgical treatment to try and decrease pain and restore function. He understood risks benefits alternatives to the procedure and wished to go forward with surgery.  OPERATIVE FINDINGS: Examination under anesthesia: No stiffness or instability.  DESCRIPTION OF PROCEDURE: The patient was identified in preoperative  holding area where I personally marked the operative site after  verifying site, side, and procedure with the patient. An interscalene block was given by the attending anesthesiologist the holding area.  The patient was taken back to the operating room where general anesthesia was  induced without complication and was placed in the beach-chair position with the back  elevated about 60 degrees and all extremities and head and neck carefully padded and  positioned.   The right upper extremity was then prepped and  draped in a standard sterile fashion. The appropriate time-out  procedure was carried out. The patient did receive IV antibiotics  within 30 minutes of incision.   A small posterior portal incision was made and the arthroscope was introduced into the joint. An anterior portal was then established above the subscapularis using needle localization. Small cannula was placed anteriorly. Diagnostic arthroscopy was then carried out.  He was noted to having intact subscapularis. Superior labrum and biceps anchor were intact with intact biceps tendon pulled into the joint. The posterior superior labrum extending around back has small flap tear which was unstable and extending into the joint. This was debrided with shaver back to good healthy non-displaceable labrum. No repair was necessary. The supraspinatus and basal carefully examined from the undersurface and a interestingly demonstrated no undersurface tearing. No apparent thinning.  The arthroscope was then introduced into the subacromial space a standard lateral portal was established with needle localization. The shaver was used through the lateral portal to perform extensive bursectomy. Coracoacromial ligament was examined and found to be severely frayed indicated chronic impingement.  After extensive bursectomy the bursal surface of the rotator cuff was carefully examined. Anteriorly in the region of concern on MRI he did have partial-thickness bursal sided tearing but it really only involved about 10% of the thickness. There were several small flaps of superficial  tendon which were debrided but the remaining tendon was intact and there was no exposed tuberosity. After an extensive debridement down to healthy intact  tendon I probed the tendon and there was felt to be no softening and the remaining tendon was healthy.  The coracoacromial ligament was taken down off the anterior acromion with the ArthroCare exposing a moderate sized hooked anterior acromial spur. A high-speed bur was then used through the lateral portal to take down the anterior acromial spur from lateral to medial in a standard acromioplasty.  The acromioplasty was also viewed from the lateral portal and the bur was used as necessary to ensure that the acromion was completely flat from posterior to anterior.   The arthroscopic equipment was removed from the joint and the portals were closed with 3-0 nylon in an interrupted fashion. Sterile dressings were then applied including Xeroform 4 x 4's ABDs and tape. The patient was then allowed to awaken from general anesthesia, placed in a sling, transferred to the stretcher and taken to the recovery room in stable condition.   POSTOPERATIVE PLAN: The patient will be discharged home today and will followup in one week for suture removal and wound check.  He will be able to get into physical therapy right away.

## 2014-03-22 NOTE — Discharge Instructions (Signed)
Discharge Instructions after Arthroscopic Shoulder Surgery   A sling has been provided for you. You may remove the sling after 72 hours. The sling may be worn for your protection, if you are in a crowd.  Use ice on the shoulder intermittently over the first 48 hours after surgery.  Pain medication has been prescribed for you.  Use your medication liberally over the first 48 hours, and then begin to taper your use. You may take Extra Strength Tylenol or Tylenol only in place of the pain pills.  You may remove your dressing after two days.  You may shower 5 days after surgery. The incision CANNOT get wet prior to 5 days. Simply allow the water to wash over the site and then pat dry. Do not rub the incision. Make sure your axilla (armpit) is completely dry after showering.  Take one aspirin a day for 2 weeks after surgery, unless you have an aspirin sensitivity/allergy or asthma.  Three to 5 times each day you should perform assisted overhead reaching and external rotation (outward turning) exercises with the operative arm. Both exercises should be done with the non-operative arm used as the "therapist arm" while the operative arm remains relaxed. Ten of each exercise should be done three to five times each day.    Overhead reach is helping to lift your stiff arm up as high as it will go. To stretch your overhead reach, lie flat on your back, relax, and grasp the wrist of the tight shoulder with your opposite hand. Using the power in your opposite arm, bring the stiff arm up as far as it is comfortable. Start holding it for ten seconds and then work up to where you can hold it for a count of 30. Breathe slowly and deeply while the arm is moved. Repeat this stretch ten times, trying to help the arm up a little higher each time.       External rotation is turning the arm out to the side while your elbow stays close to your body. External rotation is best stretched while you are lying on your back.  Hold a cane, yardstick, broom handle, or dowel in both hands. Bend both elbows to a right angle. Use steady, gentle force from your normal arm to rotate the hand of the stiff shoulder out away from your body. Continue the rotation as far as it will go comfortably, holding it there for a count of 10. Repeat this exercise ten times.     Please call [phone] during normal business hours or [phone] after hours for any problems. Including the following:  - excessive redness of the incisions - drainage for more than 4 days - fever of more than 101.5 F  *Please note that pain medications will not be refilled after hours or on weekends.    Post Anesthesia Home Care Instructions  Activity: Get plenty of rest for the remainder of the day. A responsible adult should stay with you for 24 hours following the procedure.  For the next 24 hours, DO NOT: -Drive a car -Advertising copywriterperate machinery -Drink alcoholic beverages -Take any medication unless instructed by your physician -Make any legal decisions or sign important papers.  Meals: Start with liquid foods such as gelatin or soup. Progress to regular foods as tolerated. Avoid greasy, spicy, heavy foods. If nausea and/or vomiting occur, drink only clear liquids until the nausea and/or vomiting subsides. Call your physician if vomiting continues.  Special Instructions/Symptoms: Your throat may feel dry or sore  from the anesthesia or the breathing tube placed in your throat during surgery. If this causes discomfort, gargle with warm salt water. The discomfort should disappear within 24 hours. Regional Anesthesia Blocks  1. Numbness or the inability to move the "blocked" extremity may last from 3-48 hours after placement. The length of time depends on the medication injected and your individual response to the medication. If the numbness is not going away after 48 hours, call your surgeon.  2. The extremity that is blocked will need to be protected  until the numbness is gone and the  Strength has returned. Because you cannot feel it, you will need to take extra care to avoid injury. Because it may be weak, you may have difficulty moving it or using it. You may not know what position it is in without looking at it while the block is in effect.  3. For blocks in the legs and feet, returning to weight bearing and walking needs to be done carefully. You will need to wait until the numbness is entirely gone and the strength has returned. You should be able to move your leg and foot normally before you try and bear weight or walk. You will need someone to be with you when you first try to ensure you do not fall and possibly risk injury.  4. Bruising and tenderness at the needle site are common side effects and will resolve in a few days.  5. Persistent numbness or new problems with movement should be communicated to the surgeon or the [facility] [phone])/ [facility]  [phone]).

## 2014-03-22 NOTE — Anesthesia Procedure Notes (Addendum)
Anesthesia Regional Block:  Interscalene brachial plexus block  Pre-Anesthetic Checklist: ,, timeout performed, Correct Patient, Correct Site, Correct Laterality, Correct Procedure, Correct Position, site marked, Risks and benefits discussed,  Surgical consent,  Pre-op evaluation,  At surgeon's request and post-op pain management  Laterality: Right and Upper  Prep: chloraprep       Needles:  Injection technique: Single-shot  Needle Type: Echogenic Needle     Needle Length: 5cm 5 cm Needle Gauge: 21 and 21 G    Additional Needles:  Procedures: ultrasound guided (picture in chart) Interscalene brachial plexus block Narrative:  Start time: 03/22/2014 12:43 PM End time: 03/22/2014 12:48 PM Injection made incrementally with aspirations every 5 mL.  Performed by: Personally  Anesthesiologist: CREWS, DAVID A   Procedure Name: Intubation Date/Time: 03/22/2014 1:29 PM Performed by: Caren MacadamARTER, Eylin Pontarelli W Pre-anesthesia Checklist: Patient identified, Emergency Drugs available, Suction available and Patient being monitored Patient Re-evaluated:Patient Re-evaluated prior to inductionOxygen Delivery Method: Circle System Utilized Preoxygenation: Pre-oxygenation with 100% oxygen Intubation Type: IV induction Ventilation: Mask ventilation without difficulty Laryngoscope Size: Miller and 2 Grade View: Grade I Tube type: Oral Tube size: 8.0 mm Number of attempts: 1 Airway Equipment and Method: Stylet and Oral airway Placement Confirmation: ETT inserted through vocal cords under direct vision,  positive ETCO2 and breath sounds checked- equal and bilateral Secured at: 22 cm Tube secured with: Tape Dental Injury: Teeth and Oropharynx as per pre-operative assessment

## 2014-03-22 NOTE — Anesthesia Preprocedure Evaluation (Signed)

## 2016-05-24 ENCOUNTER — Ambulatory Visit (INDEPENDENT_AMBULATORY_CARE_PROVIDER_SITE_OTHER): Payer: Managed Care, Other (non HMO) | Admitting: Sports Medicine

## 2016-05-24 DIAGNOSIS — L503 Dermatographic urticaria: Secondary | ICD-10-CM

## 2016-05-24 LAB — CBC WITH DIFFERENTIAL/PLATELET
Basophils Absolute: 0 {cells}/uL (ref 0–200)
Basophils Relative: 0 %
Eosinophils Absolute: 94 cells/uL (ref 15–500)
Eosinophils Relative: 2 %
HCT: 46.1 % (ref 38.5–50.0)
Hemoglobin: 16.2 g/dL (ref 13.2–17.1)
Lymphocytes Relative: 36 %
Lymphs Abs: 1692 cells/uL (ref 850–3900)
MCH: 32.2 pg (ref 27.0–33.0)
MCHC: 35.1 g/dL (ref 32.0–36.0)
MCV: 91.7 fL (ref 80.0–100.0)
MPV: 9.3 fL (ref 7.5–12.5)
Monocytes Absolute: 376 cells/uL (ref 200–950)
Monocytes Relative: 8 %
Neutro Abs: 2538 {cells}/uL (ref 1500–7800)
Neutrophils Relative %: 54 %
Platelets: 256 10*3/uL (ref 140–400)
RBC: 5.03 MIL/uL (ref 4.20–5.80)
RDW: 13 % (ref 11.0–15.0)
WBC: 4.7 10*3/uL (ref 3.8–10.8)

## 2016-05-24 LAB — COMPREHENSIVE METABOLIC PANEL WITH GFR
AST: 26 U/L (ref 10–40)
CO2: 24 mmol/L (ref 20–31)
Sodium: 136 mmol/L (ref 135–146)
Total Protein: 7.9 g/dL (ref 6.1–8.1)

## 2016-05-24 LAB — COMPREHENSIVE METABOLIC PANEL
ALT: 22 U/L (ref 9–46)
Albumin: 4.9 g/dL (ref 3.6–5.1)
Alkaline Phosphatase: 43 U/L (ref 40–115)
BUN: 16 mg/dL (ref 7–25)
Calcium: 9.8 mg/dL (ref 8.6–10.3)
Chloride: 103 mmol/L (ref 98–110)
Creat: 1.04 mg/dL (ref 0.60–1.35)
Glucose, Bld: 90 mg/dL (ref 65–99)
Potassium: 4.6 mmol/L (ref 3.5–5.3)
Total Bilirubin: 0.5 mg/dL (ref 0.2–1.2)

## 2016-05-24 LAB — TSH: TSH: 0.67 mIU/L (ref 0.40–4.50)

## 2016-05-24 LAB — LIPID PANEL W/REFLEX DIRECT LDL
Cholesterol: 200 mg/dL — ABNORMAL HIGH (ref <200)
HDL: 71 mg/dL (ref >40)
LDL-Cholesterol: 115 mg/dL — ABNORMAL HIGH
Non-HDL Cholesterol (Calc): 129 mg/dL (ref <130)
Total CHOL/HDL Ratio: 2.8 ratio (ref <5.0)
Triglycerides: 57 mg/dL (ref <150)

## 2016-05-24 MED ORDER — MONTELUKAST SODIUM 10 MG PO TABS: 10.0000 mg | ORAL_TABLET | Freq: Every day | ORAL | 3 refills | Status: DC

## 2016-05-24 MED ORDER — LORATADINE 10 MG PO TABS: 10.0000 mg | ORAL_TABLET | Freq: Every day | ORAL | 3 refills | Status: DC

## 2016-05-24 NOTE — Assessment & Plan Note (Signed)
Differential includes dermatographism, heat urticaria. Starting loratadine and Singulair. Return in one month, ultimately he will need to send me a picture of the rash since it's not present today.

## 2016-05-24 NOTE — Progress Notes (Signed)
  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 This also occasionally happens after getting out of a hot shower. He hasn't changed his detergents, hasn't used any new lotions, there is been no changes in his diet. No systemic symptoms, just the pruritic rash which is been completely disappears.  Past medical history:  Negative.  See flowsheet/record as well for more information.  Surgical history: Negative.  See flowsheet/record as well for more information.  Family history: Negative.  See flowsheet/record as well for more information.  Social history: Negative.  See flowsheet/record as well for more information.  Allergies, and medications have been entered into the medical record, reviewed, and no changes needed.   Review of Systems: No fevers, chills, night sweats, weight loss, chest pain, or shortness of breath.   Objective:    General: Well Developed, well nourished, and in no acute distress.  Neuro: Alert and oriented x3, extra-ocular muscles intact, sensation grossly intact.  HEENT: Normocephalic, atraumatic, pupils equal round reactive to light, neck supple, no masses, no lymphadenopathy, thyroid nonpalpable.  Skin: Warm and dry, no rashes. Cardiac: Regular rate and rhythm, no murmurs rubs or gallops, no lower extremity edema.  Respiratory: Clear to auscultation bilaterally. Not using accessory muscles, speaking in full sentences.  Impression and Recommendations:    Dermatographic urticaria Differential includes dermatographism, heat urticaria. Starting loratadine and Singulair. Return in one month, ultimately he will need to send me a picture of the rash since it's not present today.  I spent 25 minutes with this patient, greater than 50% was face-to-face time counseling regarding the above  diagnoses

## 2016-05-25 LAB — HIV ANTIBODY (ROUTINE TESTING W REFLEX): HIV 1&2 Ab, 4th Generation: NONREACTIVE

## 2016-05-25 LAB — VITAMIN D 25 HYDROXY (VIT D DEFICIENCY, FRACTURES): Vit D, 25-Hydroxy: 34 ng/mL (ref 30–100)

## 2016-05-25 LAB — HEMOGLOBIN A1C
Hgb A1c MFr Bld: 5.4 % (ref <5.7)
Mean Plasma Glucose: 108 mg/dL

## 2016-07-20 ENCOUNTER — Ambulatory Visit: Payer: Self-pay | Admitting: Sports Medicine

## 2017-03-04 ENCOUNTER — Ambulatory Visit: Payer: Self-pay | Admitting: Family Medicine

## 2017-03-05 ENCOUNTER — Encounter: Payer: Self-pay | Admitting: Sports Medicine

## 2017-03-05 ENCOUNTER — Ambulatory Visit (INDEPENDENT_AMBULATORY_CARE_PROVIDER_SITE_OTHER): Payer: Managed Care, Other (non HMO) | Admitting: Sports Medicine

## 2017-03-05 DIAGNOSIS — S46012A Strain of muscle(s) and tendon(s) of the rotator cuff of left shoulder, initial encounter: Secondary | ICD-10-CM | POA: Diagnosis not present

## 2017-03-05 DIAGNOSIS — M79644 Pain in right finger(s): Secondary | ICD-10-CM | POA: Diagnosis not present

## 2017-03-05 DIAGNOSIS — M7051 Other bursitis of knee, right knee: Secondary | ICD-10-CM

## 2017-03-05 DIAGNOSIS — M7711 Lateral epicondylitis, right elbow: Secondary | ICD-10-CM

## 2017-03-05 MED ORDER — NAPROXEN 500 MG PO TABS: 500.0000 mg | ORAL_TABLET | Freq: Two times a day (BID) | ORAL | 3 refills | Status: AC

## 2017-03-05 NOTE — Assessment & Plan Note (Signed)
This will probably get better with the prescription strength naproxen.

## 2017-03-05 NOTE — Progress Notes (Signed)
Subjective:    CC: Multiple joint complaints  HPI: Left shoulder pain: A couple of days ago slipped and fell on the ice, he caught himself with his left arm but it pulled his left shoulder and he feels as though it strain, pain is moderate, persistent, localized over the deltoid, worse with abduction.  Right elbow pain: Did some yard work with his son, dug a large 8 foot x 8 foot by a foot hole, now has pain over the lateral aspect of the elbow, worse with gripping motions.  Right index finger pain: Swelling and pain at the right second PIP, worse after doing the above yard work.  Right knee pain: Localized to the deep infrapatellar bursa/distal tibial insertion of the patellar tendon, he accidentally banged his knee on something about 3 weeks ago.  Pain is moderate, persistent, localized without radiation.  I reviewed the past medical history, family history, social history, surgical history, and allergies today and no changes were needed.  Please see the problem list section below in epic for further details.  Past Medical History: Past Medical History:  Diagnosis Date  . Traumatic tear of supraspinatus tendon of right shoulder    Past Surgical History: No past surgical history on file. Social History: Social History   Socioeconomic History  . Marital status: Married    Spouse name: None  . Number of children: None  . Years of education: None  . Highest education level: None  Social Needs  . Financial resource strain: None  . Food insecurity - worry: None  . Food insecurity - inability: None  . Transportation needs - medical: None  . Transportation needs - non-medical: None  Occupational History  . None  Tobacco Use  . Smoking status: Never Smoker  . Smokeless tobacco: Never Used  Substance and Sexual Activity  . Alcohol use: Yes    Comment: social  . Drug use: No  . Sexual activity: Yes  Other Topics Concern  . None  Social History Narrative  . None   Family  History: No family history on file. Allergies: No Known Allergies Medications: See med rec.  Review of Systems: No fevers, chills, night sweats, weight loss, chest pain, or shortness of breath.   Objective:    General: Well Developed, well nourished, and in no acute distress.  Neuro: Alert and oriented x3, extra-ocular muscles intact, sensation grossly intact.  HEENT: Normocephalic, atraumatic, pupils equal round reactive to light, neck supple, no masses, no lymphadenopathy, thyroid nonpalpable.  Skin: Warm and dry, no rashes. Cardiac: Regular rate and rhythm, no murmurs rubs or gallops, no lower extremity edema.  Respiratory: Clear to auscultation bilaterally. Not using accessory muscles, speaking in full sentences. Left shoulder: Inspection reveals no abnormalities, atrophy or asymmetry. Palpation is normal with no tenderness over AC joint or bicipital groove. ROM is full in all planes. Rotator cuff strength normal throughout. Positive Neer and Hawkin's tests, empty can. Speeds and Yergason's tests normal. No labral pathology noted with negative Obrien's, negative crank, negative clunk, and good stability. Normal scapular function observed. No painful arc and no drop arm sign. No apprehension sign Right elbow: Unremarkable to inspection. Range of motion full pronation, supination, flexion, extension. Strength is full to all of the above directions Stable to varus, valgus stress. Negative moving valgus stress test. Tender to palpation of the common extensor tendon origin Ulnar nerve does not sublux. Negative cubital tunnel Tinel's. Right knee: Normal to inspection with no erythema or effusion or obvious bony abnormalities.  Palpation normal with no warmth or joint line tenderness or patellar tenderness or condyle tenderness. ROM normal in flexion and extension and lower leg rotation. Ligaments with solid consistent endpoints including ACL, PCL, LCL, MCL. Negative Mcmurray's  and provocative meniscal tests. Non painful patellar compression. Tender to palpation at the distal patellar tendon at the tibial insertion Hamstring and quadriceps strength is normal. Right hand: Index finger somewhat swollen, fusiform at the second PIP.  Impression and Recommendations:    Rotator cuff strain, left, initial encounter Rehab exercises, prescription strength naproxen. Return to see me if no better in a month.  Right tennis elbow Naproxen, tennis elbow brace, rehab exercises given.  Finger pain, right This will probably get better with the prescription strength naproxen.  Infrapatellar bursitis of right knee Naproxen, relative rest. If no better in a month we will do an injection.  ___________________________________________ Ihor Austin. Benjamin Stain, M.D., ABFM., CAQSM. Primary Care and Sports Medicine [facility]  Adjunct Instructor of Family Medicine  [employer]

## 2017-03-05 NOTE — Assessment & Plan Note (Signed)
Naproxen, relative rest. If no better in a month we will do an injection.

## 2017-03-05 NOTE — Assessment & Plan Note (Signed)
Naproxen, tennis elbow brace, rehab exercises given.

## 2017-03-05 NOTE — Assessment & Plan Note (Signed)
Rehab exercises, prescription strength naproxen. Return to see me if no better in a month.

## 2017-03-06 ENCOUNTER — Telehealth: Payer: Self-pay

## 2017-03-06 NOTE — Telephone Encounter (Signed)
Letter is in box. 

## 2017-03-06 NOTE — Telephone Encounter (Signed)
Wife of pt left VM asking for a note for pt to return to work without restrictions and also saying he's ok to take medications as well. Please assist.

## 2017-04-02 ENCOUNTER — Ambulatory Visit: Payer: Self-pay | Admitting: Sports Medicine

## 2017-04-02 DIAGNOSIS — Z0189 Encounter for other specified special examinations: Secondary | ICD-10-CM

## 2017-04-05 ENCOUNTER — Ambulatory Visit: Payer: Self-pay | Admitting: Sports Medicine

## 2017-04-09 ENCOUNTER — Encounter: Payer: Self-pay | Admitting: Sports Medicine

## 2017-04-09 ENCOUNTER — Ambulatory Visit (INDEPENDENT_AMBULATORY_CARE_PROVIDER_SITE_OTHER): Payer: Managed Care, Other (non HMO) | Admitting: Sports Medicine

## 2017-04-09 VITALS — BP 134/83 | HR 68 | Resp 16 | Wt 163.0 lb

## 2017-04-09 DIAGNOSIS — Z23 Encounter for immunization: Secondary | ICD-10-CM | POA: Diagnosis not present

## 2017-04-09 DIAGNOSIS — M7051 Other bursitis of knee, right knee: Secondary | ICD-10-CM | POA: Diagnosis not present

## 2017-04-09 DIAGNOSIS — M545 Low back pain: Secondary | ICD-10-CM | POA: Diagnosis not present

## 2017-04-09 DIAGNOSIS — M79644 Pain in right finger(s): Secondary | ICD-10-CM

## 2017-04-09 DIAGNOSIS — G8929 Other chronic pain: Secondary | ICD-10-CM | POA: Diagnosis not present

## 2017-04-09 DIAGNOSIS — S46012A Strain of muscle(s) and tendon(s) of the rotator cuff of left shoulder, initial encounter: Secondary | ICD-10-CM | POA: Diagnosis not present

## 2017-04-09 DIAGNOSIS — Z0000 Encounter for general adult medical examination without abnormal findings: Secondary | ICD-10-CM

## 2017-04-09 DIAGNOSIS — M25511 Pain in right shoulder: Secondary | ICD-10-CM | POA: Diagnosis not present

## 2017-04-09 NOTE — Assessment & Plan Note (Signed)
Resolved with naproxen

## 2017-04-09 NOTE — Assessment & Plan Note (Signed)
Resolved with naproxen 

## 2017-04-09 NOTE — Assessment & Plan Note (Signed)
Tdap today, declines influenza

## 2017-04-09 NOTE — Addendum Note (Signed)
Addended by: Baird KayUGLAS, Kadisha Goodine M on: 04/09/2017 11:13 AM   Modules accepted: Orders

## 2017-04-09 NOTE — Assessment & Plan Note (Signed)
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 Adding some stretches, return as needed for this.  Nothing radicular, no red flags.

## 2017-04-09 NOTE — Progress Notes (Signed)
  Subjective:    CC: Follow-up  HPI: Left shoulder pain, finger pain, prepatellar bursitis: Resolved with naproxen.  Low back pain: Was working at her house, tearing out insulation over the weekend, having some back stiffness, no radiation past the lower back, no radicular symptoms, no bowel or bladder dysfunction, saddle numbness, no constitutional symptoms.  I reviewed the past medical history, family history, social history, surgical history, and allergies today and no changes were needed.  Please see the problem list section below in epic for further details.  Past Medical History: Past Medical History:  Diagnosis Date  . Traumatic tear of supraspinatus tendon of right shoulder    Past Surgical History: No past surgical history on file. Social History: Social History   Socioeconomic History  . Marital status: Married    Spouse name: None  . Number of children: None  . Years of education: None  . Highest education level: None  Social Needs  . Financial resource strain: None  . Food insecurity - worry: None  . Food insecurity - inability: None  . Transportation needs - medical: None  . Transportation needs - non-medical: None  Occupational History  . None  Tobacco Use  . Smoking status: Never Smoker  . Smokeless tobacco: Never Used  Substance and Sexual Activity  . Alcohol use: Yes    Comment: social  . Drug use: No  . Sexual activity: Yes  Other Topics Concern  . None  Social History Narrative  . None   Family History: No family history on file. Allergies: No Known Allergies Medications: See med rec.  Review of Systems: No fevers, chills, night sweats, weight loss, chest pain, or shortness of breath.   Objective:    General: Well Developed, well nourished, and in no acute distress.  Neuro: Alert and oriented x3, extra-ocular muscles intact, sensation grossly intact.  HEENT: Normocephalic, atraumatic, pupils equal round reactive to light, neck supple, no  masses, no lymphadenopathy, thyroid nonpalpable.  Skin: Warm and dry, no rashes. Cardiac: Regular rate and rhythm, no murmurs rubs or gallops, no lower extremity edema.  Respiratory: Clear to auscultation bilaterally. Not using accessory muscles, speaking in full sentences. Back Exam:  Inspection: Unremarkable  Motion: Flexion 45 deg, Extension 45 deg, Side Bending to 45 deg bilaterally,  Rotation to 45 deg bilaterally  SLR laying: Negative  XSLR laying: Negative  Palpable tenderness: None. FABER: negative. Sensory change: Gross sensation intact to all lumbar and sacral dermatomes.  Reflexes: 2+ at both patellar tendons, 2+ at achilles tendons, Babinski's downgoing.  Strength at foot  Plantar-flexion: 5/5 Dorsi-flexion: 5/5 Eversion: 5/5 Inversion: 5/5  Leg strength  Quad: 5/5 Hamstring: 5/5 Hip flexor: 5/5 Hip abductors: 5/5  Gait unremarkable.  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 Adding some stretches, return as needed for this.  Nothing radicular, no red flags.  Right shoulder pain Resolved with naproxen.  Rotator cuff strain, left, initial encounter Resolved with naproxen  Annual physical exam Tdap today, declines influenza ___________________________________________ Ihor Austinhomas J. Benjamin Stainhekkekandam, M.D., ABFM., CAQSM. Primary Care and Sports Medicine [facility]  Adjunct Instructor of Family Medicine  [employer]

## 2018-06-02 ENCOUNTER — Telehealth: Payer: Self-pay | Admitting: Physician Assistant

## 2018-06-02 NOTE — Telephone Encounter (Signed)
Spoke with patient's wife. She states patient is feeling better. He was lethargic on Friday, but his symptoms have lessened over the weekend. He is taking Mucinex and resting. They declined visit for now, but will call back with any changes.

## 2018-06-02 NOTE — Telephone Encounter (Signed)
[facility] called with sinusitis symptoms for less than a week. Symptomatic care given with mucinex and flonase. Once 7 days schedule virtual visit.

## 2020-02-15 ENCOUNTER — Ambulatory Visit (INDEPENDENT_AMBULATORY_CARE_PROVIDER_SITE_OTHER): Payer: BC Managed Care – PPO | Admitting: Sports Medicine

## 2020-02-15 ENCOUNTER — Ambulatory Visit (INDEPENDENT_AMBULATORY_CARE_PROVIDER_SITE_OTHER): Payer: BC Managed Care – PPO

## 2020-02-15 ENCOUNTER — Other Ambulatory Visit: Payer: Self-pay

## 2020-02-15 DIAGNOSIS — G8929 Other chronic pain: Secondary | ICD-10-CM | POA: Diagnosis not present

## 2020-02-15 DIAGNOSIS — M545 Low back pain, unspecified: Secondary | ICD-10-CM | POA: Diagnosis not present

## 2020-02-15 DIAGNOSIS — L719 Rosacea, unspecified: Secondary | ICD-10-CM | POA: Diagnosis not present

## 2020-02-15 MED ORDER — KETOROLAC TROMETHAMINE 30 MG/ML IJ SOLN
30.0000 mg | Freq: Once | INTRAMUSCULAR | Status: AC
  Administered 2020-02-15: 17:00:00 30 mg via INTRAMUSCULAR

## 2020-02-15 MED ORDER — PREDNISONE 50 MG PO TABS: ORAL_TABLET | ORAL | 0 refills | Status: DC

## 2020-02-15 MED ORDER — HYDROCODONE-ACETAMINOPHEN 10-325 MG PO TABS: 1.0000 | ORAL_TABLET | Freq: Three times a day (TID) | ORAL | 0 refills | Status: DC | PRN

## 2020-02-15 MED ORDER — METHYLPREDNISOLONE SODIUM SUCC 125 MG IJ SOLR
125.0000 mg | Freq: Once | INTRAMUSCULAR | Status: AC
  Administered 2020-02-15: 17:00:00 125 mg via INTRAMUSCULAR

## 2020-02-15 MED ORDER — METRONIDAZOLE 0.75 % EX GEL: 1.0000 "application " | Freq: Two times a day (BID) | CUTANEOUS | 3 refills | Status: DC

## 2020-02-15 MED ORDER — CYCLOBENZAPRINE HCL 10 MG PO TABS: ORAL_TABLET | ORAL | 0 refills | Status: DC

## 2020-02-15 NOTE — Assessment & Plan Note (Signed)
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

## 2020-02-15 NOTE — Assessment & Plan Note (Signed)
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 Nothing radicular, no bowel or bladder dysfunction, saddle numbness. He also has some left-sided upper neck pain. He is in a lot of pain today so we will treat him aggressively with Toradol and Solu-Medrol intramuscular in the office. Adding 5 days of prednisone, muscle relaxers, as well as a short course of hydrocodone tens, we will just print out that prescription so he can hold onto it if he needs it. Home physical therapy, x-rays of the lumbar spine. He will be out of work for at least 2 weeks, and I like to see him back in about 3 to 4 weeks.

## 2020-02-15 NOTE — Progress Notes (Signed)
    Procedures performed today:    None.  Independent interpretation of notes and tests performed by another provider:   None.  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 Nothing radicular, no bowel or bladder dysfunction, saddle numbness. He also has some left-sided upper neck pain. He is in a lot of pain today so we will treat him aggressively with Toradol and Solu-Medrol intramuscular in the office. Adding 5 days of prednisone, muscle relaxers, as well as a short course of hydrocodone tens, we will just print out that prescription so he can hold onto it if he needs it. Home physical therapy, x-rays of the lumbar spine. He will be out of work for at least 2 weeks, and I like to see him back in about 3 to 4 weeks.  Rosacea, papulopustular Caylen does have acneiform papulopustular rosacea, adding topical metronidazole, if no better in 3 months we can switch to [place].    ___________________________________________ Ihor Austin. Benjamin Stain, M.D., ABFM., CAQSM. Primary Care and Sports Medicine [facility]  Adjunct Instructor of Family Medicine  [employer]

## 2020-03-11 ENCOUNTER — Ambulatory Visit: Payer: BC Managed Care – PPO | Admitting: Sports Medicine

## 2021-10-02 ENCOUNTER — Ambulatory Visit (INDEPENDENT_AMBULATORY_CARE_PROVIDER_SITE_OTHER): Payer: Managed Care, Other (non HMO) | Admitting: Family Medicine

## 2021-10-02 ENCOUNTER — Encounter: Payer: Self-pay | Admitting: Family Medicine

## 2021-10-02 VITALS — BP 117/76 | HR 82 | Wt 168.0 lb

## 2021-10-02 DIAGNOSIS — J101 Influenza due to other identified influenza virus with other respiratory manifestations: Secondary | ICD-10-CM | POA: Diagnosis not present

## 2021-10-02 DIAGNOSIS — R051 Acute cough: Secondary | ICD-10-CM | POA: Diagnosis not present

## 2021-10-02 LAB — POCT INFLUENZA A/B: Influenza B, POC: POSITIVE — AB

## 2021-10-02 MED ORDER — OSELTAMIVIR PHOSPHATE 75 MG PO CAPS: 75.0000 mg | ORAL_CAPSULE | Freq: Two times a day (BID) | ORAL | 0 refills | Status: DC

## 2021-10-02 NOTE — Progress Notes (Signed)
Acute Office Visit  Subjective:     Patient ID: Victor Donaldson, male    DOB: 1980-11-21, 41 y.o.   MRN: [id number]  Chief Complaint  Patient presents with   Cough    HPI Patient is in today for fever, bodyaches x 3 days with cough, green mucous.  First day he had a headache and just muscle soreness and thought maybe he was little dehydrated so drink some Pedialyte.  But started to feel worse the next day which was yesterday.  Has had covid 3 times in the past.  He has had a burning sensation in his back but he says that is not necessarily unusual when he gets chills with an illness.  He is had a lot of nasal congestion.  He feels like there is a lot of tightness and discomfort in his lower lungs bilaterally.  And says every few hours he will cough up a large amount of mucus.  He did take Mucinex next max last night.  Fever to 102 last night.  No GI symptoms.  In fact he has not had a bowel movement.  No history of asthma or underlying respiratory issues.  ROS      Objective:    BP 117/76   Pulse 82   Wt 168 lb (76.2 kg)   BMI 21.57 kg/m    Physical Exam Constitutional:      Appearance: He is well-developed.  HENT:     Head: Normocephalic and atraumatic.     Right Ear: Tympanic membrane, ear canal and external ear normal.     Left Ear: Tympanic membrane, ear canal and external ear normal.     Nose: Nose normal.     Mouth/Throat:     Pharynx: Oropharynx is clear.     Comments: Positive cobblestoning Eyes:     Conjunctiva/sclera: Conjunctivae normal.     Pupils: Pupils are equal, round, and reactive to light.  Neck:     Thyroid: No thyromegaly.  Cardiovascular:     Rate and Rhythm: Normal rate.     Heart sounds: Normal heart sounds.  Pulmonary:     Effort: Pulmonary effort is normal.     Comments: Mild diffuse rhonchi.  No crackles. Musculoskeletal:     Cervical back: Neck supple.  Lymphadenopathy:     Cervical: No cervical adenopathy.  Skin:    General: Skin is  warm and dry.  Neurological:     Mental Status: He is alert and oriented to person, place, and time.  Psychiatric:        Mood and Affect: Mood normal.     Results for orders placed or performed in visit on 10/02/21  POCT Influenza A/B  Result Value Ref Range   Influenza A, POC     Influenza B, POC Positive (A) Negative        Assessment & Plan:   Problem List Items Addressed This Visit   None Visit Diagnoses     Acute cough    -  Primary   Relevant Orders   POCT Influenza A/B (Completed)   Novel Coronavirus, NAA ([facility])   Influenza B       Relevant Medications   oseltamivir (TAMIFLU) 75 MG capsule      Flu B-he does have some rhonchi-we discussed starting Tamiflu ASAP.  If he feels like he is getting worse or developing new symptoms or develop any shortness of breath then please let us know and we will get chest x-ray at  that time.  Meds ordered this encounter  Medications   oseltamivir (TAMIFLU) 75 MG capsule    Sig: Take 1 capsule (75 mg total) by mouth 2 (two) times daily.    Dispense:  10 capsule    Refill:  0    No follow-ups on file.  Nani Gasser, MD

## 2021-10-03 ENCOUNTER — Encounter: Payer: Self-pay | Admitting: Family Medicine

## 2021-10-03 LAB — NOVEL CORONAVIRUS, NAA: SARS-CoV-2, NAA: NOT DETECTED

## 2021-10-03 LAB — SPECIMEN STATUS REPORT

## 2021-11-20 ENCOUNTER — Ambulatory Visit: Payer: Managed Care, Other (non HMO)

## 2021-11-20 ENCOUNTER — Ambulatory Visit (INDEPENDENT_AMBULATORY_CARE_PROVIDER_SITE_OTHER): Payer: Managed Care, Other (non HMO) | Admitting: Sports Medicine

## 2021-11-20 ENCOUNTER — Encounter: Payer: Self-pay | Admitting: Sports Medicine

## 2021-11-20 DIAGNOSIS — R052 Subacute cough: Secondary | ICD-10-CM

## 2021-11-20 DIAGNOSIS — R059 Cough, unspecified: Secondary | ICD-10-CM | POA: Insufficient documentation

## 2021-11-20 LAB — POCT INFLUENZA A/B
Influenza A, POC: NEGATIVE
Influenza B, POC: NEGATIVE

## 2021-11-20 LAB — POC COVID19 BINAXNOW: SARS Coronavirus 2 Ag: NEGATIVE

## 2021-11-20 MED ORDER — PREDNISONE 50 MG PO TABS: ORAL_TABLET | ORAL | 0 refills | Status: DC

## 2021-11-20 MED ORDER — ALBUTEROL SULFATE HFA 108 (90 BASE) MCG/ACT IN AERS: 2.0000 | INHALATION_SPRAY | Freq: Four times a day (QID) | RESPIRATORY_TRACT | 11 refills | Status: AC | PRN

## 2021-11-20 MED ORDER — BENZONATATE 200 MG PO CAPS: 200.0000 mg | ORAL_CAPSULE | Freq: Three times a day (TID) | ORAL | 0 refills | Status: DC | PRN

## 2021-11-20 NOTE — Progress Notes (Signed)
    Procedures performed today:    None.  Independent interpretation of notes and tests performed by another provider:   None.  Brief History, Exam, Impression, and Recommendations:    Coughing Merrily Pew is a very pleasant previously healthy 41 year old male, he was seen about 6 weeks ago for a viral syndrome, ultimately tested positive for influenza B, which was treated with Tamiflu. Unfortunately he has continued to have a cough for the past 6 weeks, only minimally productive. He is also developing a new onset headache. Unclear etiology, postinfectious cough can last for 6 weeks, but I would like to look a little further. His exam is for the most part benign, head and neck exam unrevealing, lungs are clear. He is coughing in the exam room. Adding chest x-ray, prednisone, Tessalon Perles, albuterol, we are also going to swab him again for COVID and flu though I have a very low index of suspicion for this. I like to see him back if not feeling significantly better in a month at which point we will probably get pre and postbronchodilator spirometry and get a high-resolution CT.  I spent 30 minutes of total time managing this patient today, this includes chart review, face to face, and non-face to face time.  ____________________________________________ Gwen Her. Dianah Field, M.D., ABFM., CAQSM., AME. Primary Care and Sports Medicine [facility]  Adjunct Professor of [employer]  Risk manager

## 2021-11-20 NOTE — Addendum Note (Signed)
Addended by: Tarri Glenn A on: 11/20/2021 04:28 PM   Modules accepted: Orders

## 2021-11-20 NOTE — Assessment & Plan Note (Signed)
Victor Donaldson is a very pleasant previously healthy 41 year old male, he was seen about 6 weeks ago for a viral syndrome, ultimately tested positive for influenza B, which was treated with Tamiflu. Unfortunately he has continued to have a cough for the past 6 weeks, only minimally productive. He is also developing a new onset headache. Unclear etiology, postinfectious cough can last for 6 weeks, but I would like to look a little further. His exam is for the most part benign, head and neck exam unrevealing, lungs are clear. He is coughing in the exam room. Adding chest x-ray, prednisone, Tessalon Perles, albuterol, we are also going to swab him again for COVID and flu though I have a very low index of suspicion for this. I like to see him back if not feeling significantly better in a month at which point we will probably get pre and postbronchodilator spirometry and get a high-resolution CT.

## 2022-06-30 IMAGING — DX DG LUMBAR SPINE COMPLETE 4+V
5 series · 5 of 5 positions shown · non-contrast
Comparison: None.

CLINICAL DATA: Sudden onset left back pain

EXAM:
LUMBAR SPINE - COMPLETE 4+ VIEW

[l-spine ap]
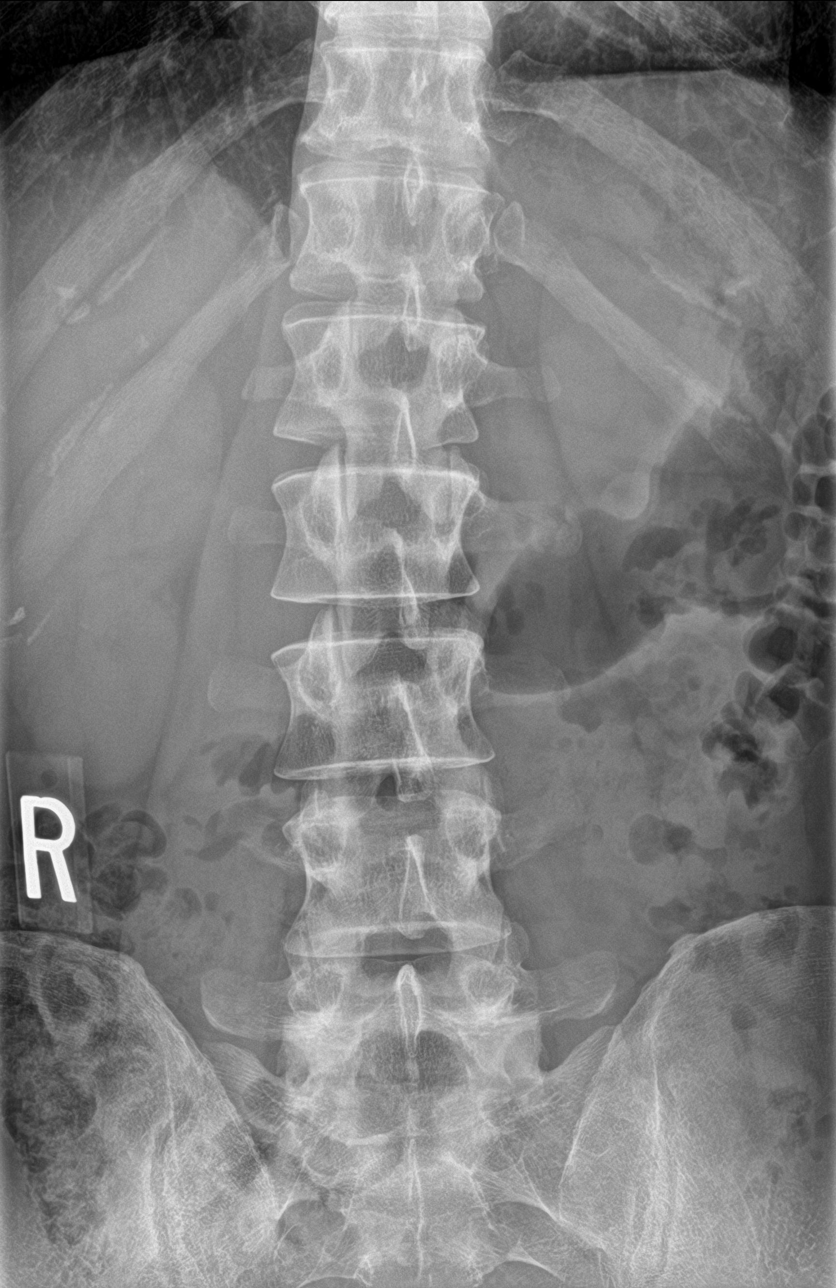

[l-spine obl (1 of 2)]
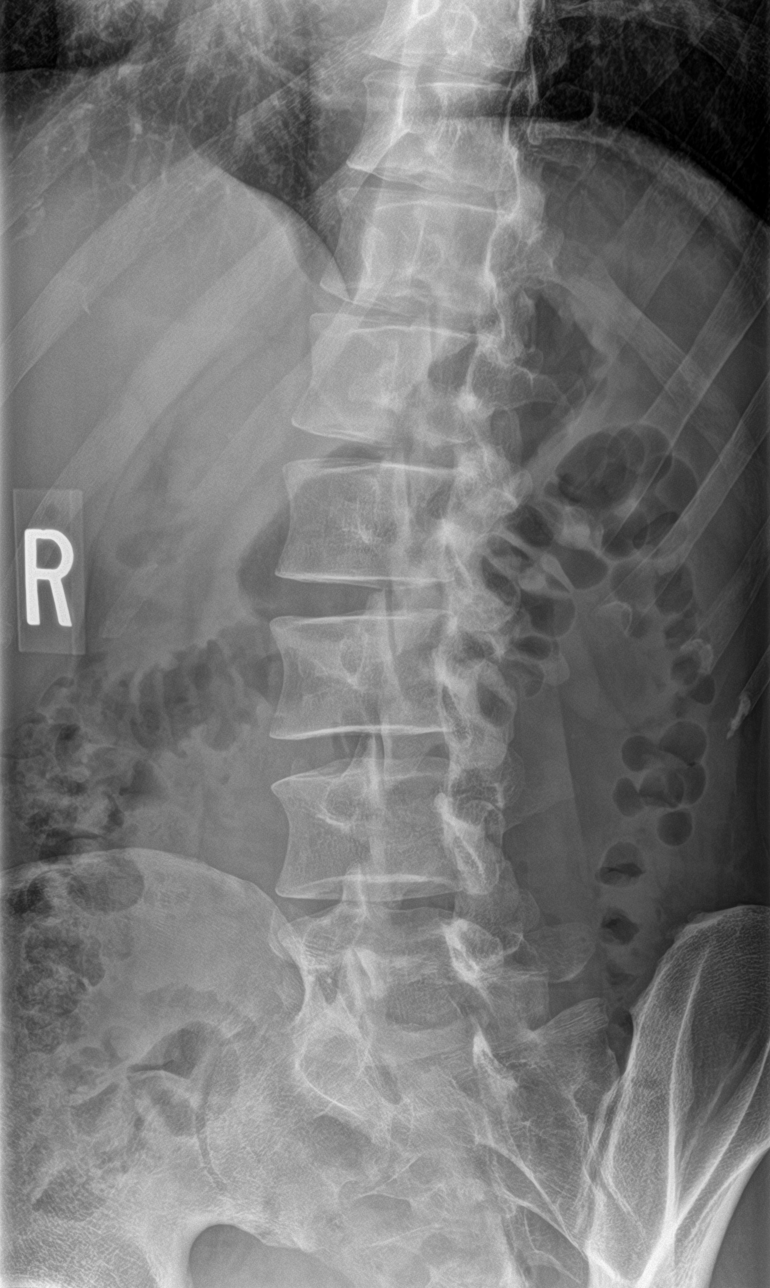

[l-spine obl (2 of 2)]
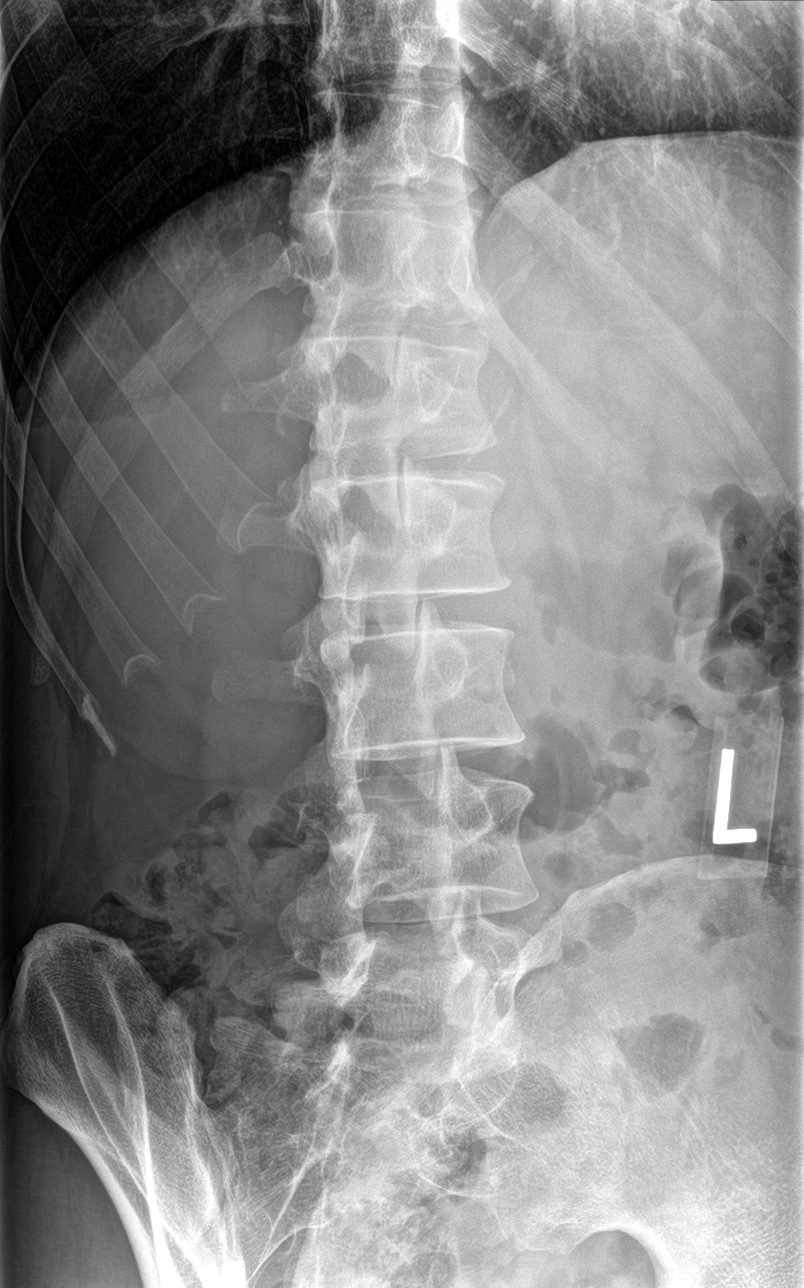

[l-spine spot]
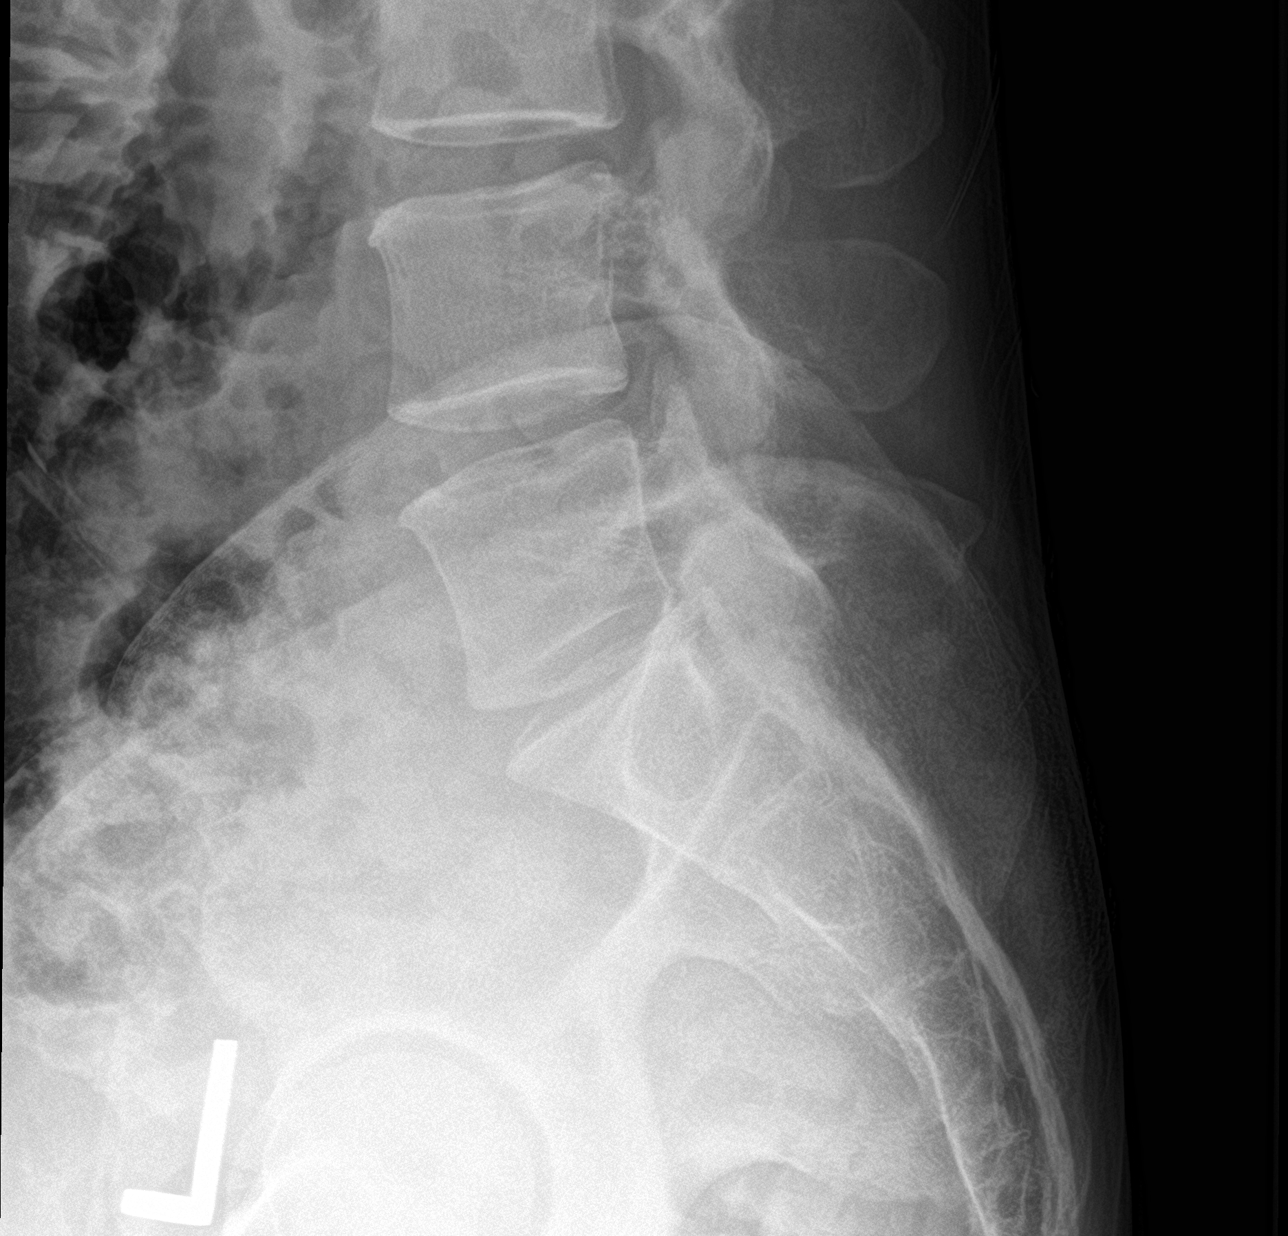

[l-spine lat]
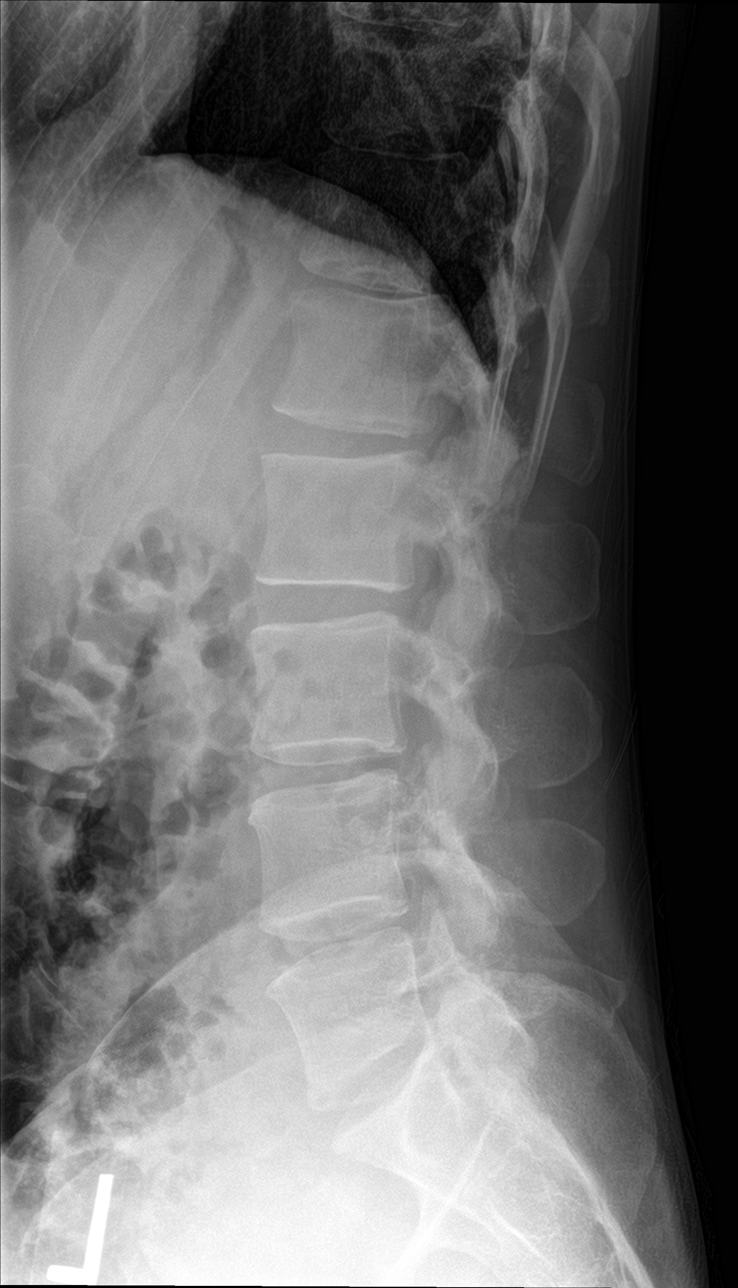

[5 of 5 positions shown; findings below may reference images not displayed]

FINDINGS: Mild dextrocurvature of the lumbar spine. Sagittal alignment within
normal limits. Vertebral body heights and disc spaces appear within
normal limits.
IMPRESSION: Negative.

## 2022-09-10 ENCOUNTER — Encounter: Payer: Self-pay | Admitting: Family Medicine

## 2022-09-10 ENCOUNTER — Ambulatory Visit (INDEPENDENT_AMBULATORY_CARE_PROVIDER_SITE_OTHER): Payer: Managed Care, Other (non HMO) | Admitting: Family Medicine

## 2022-09-10 VITALS — BP 119/79 | HR 68 | Ht 74.0 in | Wt 169.0 lb

## 2022-09-10 DIAGNOSIS — L820 Inflamed seborrheic keratosis: Secondary | ICD-10-CM

## 2022-09-10 NOTE — Progress Notes (Signed)
   Acute Office Visit  Subjective:     Patient ID: Victor Donaldson, male    DOB: 07/15/1980, 42 y.o.   MRN: [id number]  Chief Complaint  Patient presents with   Rash    HPI Patient is in today for spot on back.  He says he feels like it has been getting a little larger in size it is becoming more irritated and itchy and even tender.  And more recently it was bleeding.  He wanted to have it checked out today.  ROS      Objective:    BP 119/79 (BP Location: Left Arm, Patient Position: Sitting, Cuff Size: Normal)   Pulse 68   Ht 6\' 2"  (1.88 m)   Wt 169 lb (76.7 kg)   SpO2 96%   BMI 21.70 kg/m    Physical Exam   He has about 3 skin lesions on his back that are consistent with seborrheic keratoses.  The largest and most raised is the one that is causing the irritation and bleeding.  No active bleeding or crusting today.   No results found for any visits on 09/10/22.      Assessment & Plan:   Problem List Items Addressed This Visit   None Visit Diagnoses     Seborrheic keratoses, inflamed    -  Primary     Seborrheic keratoses-discussed benign nature of these lesions.  But certainly the one in his mid back on the left side is very inflamed.  Recommend treatment with cryotherapy.  He said he would prefer to treated at home and let us know if it is not improving or resolving.  No suspicious features on exam today.  It is an approximately 1 cm grayish-brown raised papular lesion with a dry surface.  No orders of the defined types were placed in this encounter.   No follow-ups on file.  Nani Gasser, MD

## 2023-06-06 ENCOUNTER — Encounter: Payer: Self-pay | Admitting: Sports Medicine

## 2023-06-06 ENCOUNTER — Ambulatory Visit (INDEPENDENT_AMBULATORY_CARE_PROVIDER_SITE_OTHER): Admitting: Sports Medicine

## 2023-06-06 VITALS — BP 131/74 | HR 62 | Resp 20 | Ht 74.0 in

## 2023-06-06 DIAGNOSIS — M25532 Pain in left wrist: Secondary | ICD-10-CM

## 2023-06-06 DIAGNOSIS — L719 Rosacea, unspecified: Secondary | ICD-10-CM | POA: Diagnosis not present

## 2023-06-06 DIAGNOSIS — Z0000 Encounter for general adult medical examination without abnormal findings: Secondary | ICD-10-CM

## 2023-06-06 NOTE — Assessment & Plan Note (Signed)
 We did an annual physical today, he gets his labs through the [facility]. Will have them forwarded to me. Return in a year for this.

## 2023-06-06 NOTE — Progress Notes (Signed)
    Procedures performed today:    None.  Independent interpretation of notes and tests performed by another provider:   None.  Brief History, Exam, Impression, and Recommendations:    Annual physical exam We did an annual physical today, he gets his labs through the [facility]. Will have them forwarded to me. Return in a year for this.  Acute wrist pain, left 2 weeks ago had an injury where his wrist was pinned between 2 pieces of wood, since then he has had some pain that he localizes dorsal mid radiocarpal joint, no mechanical symptoms, on exam he does have some fluctuance, he does have a minimally painful lunotriquetral shuck. Other exam and Hoo[place] are normal, good motion, good strength. Suspect more of a sprain, he will do some wrist conditioning over the next 4 weeks and if persistent discomfort we can get imaging to rule out an intercarpal ligament injury.  Rosacea, papulopustular Not responsive to metronidazole, not interested in azelaic acid at this juncture. We can watch this for now.    ____________________________________________ Joselyn Nicely. Sandy Crumb, M.D., ABFM., CAQSM., AME. Primary Care and Sports Medicine [facility]  Adjunct Professor of [facility] Medicine  [employer]  Restaurant manager, fast food

## 2023-06-06 NOTE — Assessment & Plan Note (Signed)
 Not responsive to metronidazole, not interested in azelaic acid at this juncture. We can watch this for now.

## 2023-06-06 NOTE — Assessment & Plan Note (Signed)
 2 weeks ago had an injury where his wrist was pinned between 2 pieces of wood, since then he has had some pain that he localizes dorsal mid radiocarpal joint, no mechanical symptoms, on exam he does have some fluctuance, he does have a minimally painful lunotriquetral shuck. Other exam and Hoovers are normal, good motion, good strength. Suspect more of a sprain, he will do some wrist conditioning over the next 4 weeks and if persistent discomfort we can get imaging to rule out an intercarpal ligament injury.

## 2023-07-02 ENCOUNTER — Ambulatory Visit: Admitting: Sports Medicine

## 2023-10-22 ENCOUNTER — Encounter: Payer: Self-pay | Admitting: Sports Medicine
